# Patient Record
Sex: Female | Born: 1935 | Race: Black or African American | Hispanic: No | Marital: Married
Health system: Southern US, Community
[De-identification: ages and names within clinical notes are randomized; demographics above are authoritative.]

## PROBLEM LIST (undated history)

## (undated) DIAGNOSIS — A419 Sepsis, unspecified organism: Secondary | ICD-10-CM

## (undated) DIAGNOSIS — E049 Nontoxic goiter, unspecified: Secondary | ICD-10-CM

## (undated) DIAGNOSIS — F0390 Unspecified dementia without behavioral disturbance: Secondary | ICD-10-CM

## (undated) DIAGNOSIS — J9621 Acute and chronic respiratory failure with hypoxia: Principal | ICD-10-CM

## (undated) DIAGNOSIS — J690 Pneumonitis due to inhalation of food and vomit: Secondary | ICD-10-CM

## (undated) DIAGNOSIS — N183 Chronic kidney disease, stage 3 (moderate): Secondary | ICD-10-CM

## (undated) DIAGNOSIS — D649 Anemia, unspecified: Secondary | ICD-10-CM

## (undated) DIAGNOSIS — K222 Esophageal obstruction: Secondary | ICD-10-CM

## (undated) DIAGNOSIS — R6521 Severe sepsis with septic shock: Secondary | ICD-10-CM

---

## 2006-09-17 ENCOUNTER — Ambulatory Visit (HOSPITAL_COMMUNITY): Admission: RE | Admit: 2006-09-17 | Discharge: 2006-09-17 | Payer: Self-pay | Admitting: Cardiology

## 2018-06-19 ENCOUNTER — Ambulatory Visit (HOSPITAL_COMMUNITY)
Admission: AD | Admit: 2018-06-19 | Discharge: 2018-06-19 | Disposition: A | Discharge status: Home / Self Care | Payer: Medicare Other | Source: Other Acute Inpatient Hospital | Attending: Internal Medicine | Admitting: Internal Medicine

## 2018-06-19 ENCOUNTER — Inpatient Hospital Stay
Admission: RE | Admit: 2018-06-19 | Discharge: 2018-07-21 | Disposition: A | Discharge status: Skilled Nursing Facility | Payer: Medicare Other | Source: Other Acute Inpatient Hospital | Attending: Internal Medicine | Admitting: Internal Medicine

## 2018-06-19 ENCOUNTER — Institutional Professional Consult (permissible substitution) (HOSPITAL_COMMUNITY): Payer: Medicare (Managed Care)

## 2018-06-19 DIAGNOSIS — N183 Chronic kidney disease, stage 3 unspecified: Secondary | ICD-10-CM

## 2018-06-19 DIAGNOSIS — J9690 Respiratory failure, unspecified, unspecified whether with hypoxia or hypercapnia: Secondary | ICD-10-CM | POA: Insufficient documentation

## 2018-06-19 DIAGNOSIS — R6521 Severe sepsis with septic shock: Secondary | ICD-10-CM

## 2018-06-19 DIAGNOSIS — F0390 Unspecified dementia without behavioral disturbance: Secondary | ICD-10-CM

## 2018-06-19 DIAGNOSIS — R0902 Hypoxemia: Secondary | ICD-10-CM

## 2018-06-19 DIAGNOSIS — Z431 Encounter for attention to gastrostomy: Secondary | ICD-10-CM

## 2018-06-19 DIAGNOSIS — J9621 Acute and chronic respiratory failure with hypoxia: Secondary | ICD-10-CM

## 2018-06-19 DIAGNOSIS — J690 Pneumonitis due to inhalation of food and vomit: Secondary | ICD-10-CM

## 2018-06-19 DIAGNOSIS — A419 Sepsis, unspecified organism: Secondary | ICD-10-CM

## 2018-06-19 HISTORY — DX: Chronic kidney disease, stage 3 (moderate): N18.3

## 2018-06-19 HISTORY — DX: Esophageal obstruction: K22.2

## 2018-06-19 HISTORY — DX: Unspecified dementia without behavioral disturbance: F03.90

## 2018-06-19 HISTORY — DX: Acute and chronic respiratory failure with hypoxia: J96.21

## 2018-06-19 HISTORY — DX: Sepsis, unspecified organism: A41.9

## 2018-06-19 HISTORY — DX: Nontoxic goiter, unspecified: E04.9

## 2018-06-19 HISTORY — DX: Pneumonitis due to inhalation of food and vomit: J69.0

## 2018-06-19 HISTORY — DX: Severe sepsis with septic shock: R65.21

## 2018-06-19 HISTORY — DX: Anemia, unspecified: D64.9

## 2018-06-19 LAB — BLOOD GAS, ARTERIAL
ACID-BASE EXCESS: 3.1 mmol/L — AB (ref 0.0–2.0)
BICARBONATE: 26.7 mmol/L (ref 20.0–28.0)
FIO2: 35
LHR: 10 {breaths}/min
MECHVT: 350 mL
O2 Saturation: 90.8 %
PATIENT TEMPERATURE: 98.6
PCO2 ART: 37.5 mmHg (ref 32.0–48.0)
PEEP/CPAP: 5 cmH2O
PH ART: 7.466 — AB (ref 7.350–7.450)
PO2 ART: 58.6 mmHg — AB (ref 83.0–108.0)

## 2018-06-19 MED ORDER — IOPAMIDOL (ISOVUE-300) INJECTION 61%
INTRAVENOUS | Status: AC
  Filled 2018-06-19: qty 50

## 2018-06-20 ENCOUNTER — Encounter: Payer: Self-pay | Admitting: Internal Medicine

## 2018-06-20 DIAGNOSIS — J9621 Acute and chronic respiratory failure with hypoxia: Secondary | ICD-10-CM

## 2018-06-20 DIAGNOSIS — R6521 Severe sepsis with septic shock: Secondary | ICD-10-CM

## 2018-06-20 DIAGNOSIS — A419 Sepsis, unspecified organism: Secondary | ICD-10-CM

## 2018-06-20 DIAGNOSIS — J690 Pneumonitis due to inhalation of food and vomit: Secondary | ICD-10-CM

## 2018-06-20 DIAGNOSIS — N183 Chronic kidney disease, stage 3 unspecified: Secondary | ICD-10-CM

## 2018-06-20 DIAGNOSIS — F0390 Unspecified dementia without behavioral disturbance: Secondary | ICD-10-CM

## 2018-06-20 HISTORY — DX: Chronic kidney disease, stage 3 unspecified: N18.30

## 2018-06-20 HISTORY — DX: Sepsis, unspecified organism: A41.9

## 2018-06-20 HISTORY — DX: Unspecified dementia, unspecified severity, without behavioral disturbance, psychotic disturbance, mood disturbance, and anxiety: F03.90

## 2018-06-20 HISTORY — DX: Pneumonitis due to inhalation of food and vomit: J69.0

## 2018-06-20 HISTORY — DX: Acute and chronic respiratory failure with hypoxia: J96.21

## 2018-06-20 LAB — COMPREHENSIVE METABOLIC PANEL
ALT: 21 U/L (ref 0–44)
AST: 29 U/L (ref 15–41)
Albumin: 2.5 g/dL — ABNORMAL LOW (ref 3.5–5.0)
Alkaline Phosphatase: 108 U/L (ref 38–126)
Anion gap: 11 (ref 5–15)
BILIRUBIN TOTAL: 0.4 mg/dL (ref 0.3–1.2)
BUN: 29 mg/dL — ABNORMAL HIGH (ref 8–23)
CHLORIDE: 102 mmol/L (ref 98–111)
CO2: 26 mmol/L (ref 22–32)
CREATININE: 1.19 mg/dL — AB (ref 0.44–1.00)
Calcium: 9.4 mg/dL (ref 8.9–10.3)
GFR, EST AFRICAN AMERICAN: 47 mL/min — AB (ref >60)
GFR, EST NON AFRICAN AMERICAN: 40 mL/min — AB (ref >60)
Glucose, Bld: 110 mg/dL — ABNORMAL HIGH (ref 70–99)
POTASSIUM: 3.9 mmol/L (ref 3.5–5.1)
Sodium: 139 mmol/L (ref 135–145)
TOTAL PROTEIN: 6 g/dL — AB (ref 6.5–8.1)

## 2018-06-20 LAB — CBC WITH DIFFERENTIAL/PLATELET
Abs Immature Granulocytes: 0 10*3/uL (ref 0.0–0.1)
BASOS PCT: 1 %
Basophils Absolute: 0 10*3/uL (ref 0.0–0.1)
EOS ABS: 0.3 10*3/uL (ref 0.0–0.7)
Eosinophils Relative: 7 %
HCT: 30.1 % — ABNORMAL LOW (ref 36.0–46.0)
Hemoglobin: 9.3 g/dL — ABNORMAL LOW (ref 12.0–15.0)
Immature Granulocytes: 1 %
Lymphocytes Relative: 17 %
Lymphs Abs: 0.8 10*3/uL (ref 0.7–4.0)
MCH: 27.3 pg (ref 26.0–34.0)
MCHC: 30.9 g/dL (ref 30.0–36.0)
MCV: 88.3 fL (ref 78.0–100.0)
MONO ABS: 0.7 10*3/uL (ref 0.1–1.0)
MONOS PCT: 13 %
Neutro Abs: 3 10*3/uL (ref 1.7–7.7)
Neutrophils Relative %: 61 %
PLATELETS: 311 10*3/uL (ref 150–400)
RBC: 3.41 MIL/uL — ABNORMAL LOW (ref 3.87–5.11)
RDW: 15.9 % — AB (ref 11.5–15.5)
WBC: 4.9 10*3/uL (ref 4.0–10.5)

## 2018-06-20 LAB — PROTIME-INR
INR: 1.06
PROTHROMBIN TIME: 13.8 s (ref 11.4–15.2)

## 2018-06-20 NOTE — Consult Note (Signed)
[facility]  Date of Service: 06/20/2018  PULMONARY CONSULT   Erica PoissonLoretta Y Johnson  NWG:[id number]  DOB: 1930-07-09   DOA: 06/19/2018  Referring Physician: Carron CurieAli Hijazi, MD  HPI: Erica Johnson is a 84 y.o. female seen for follow up of Acute on Chronic Respiratory Failure.  Patient has multiple medical problems including dementia chronic goiter esophageal obstruction who presented to the hospital with a history of coughing.  Apparently coughing was going on for a couple of days prior to admission.  In addition the family states that she was less vocal.  She was also found to be hypothermic.  On admission her white count was decreased and she was dehydrated.  She was also noted to be hypoglycemic and initially was started on D5 normal saline for the low sugar.  Patient's blood pressure was also low and she was started on levo fed on admission.  It is recorded that the additional blood pressure was 68/33.  After fluid resuscitation patient did show some improvement.  Hospital course was as follows.  She was initially felt to be septic cultures were done that were negative.  Patient was switched over to oral antibiotics and it was improved white count.  She did have the renal failure she was resuscitated with fluids and apparently her baseline creatinine is around 1.6.  Patient was transferred to our facility for further management.  Right now she is on the ventilator and assist control mode with an FiO2 of 35%.  Patient is requiring a PEEP of 5 and today she was started on a wean.  Review of Systems:  ROS performed and is unremarkable other than noted above.  Past Medical History:  Diagnosis Date  . Acute on chronic respiratory failure with hypoxia (HCC) 06/20/2018  . Aspiration pneumonia of both lower lobes due to gastric secretions (HCC) 06/20/2018  . Chronic anemia   . CKD (chronic kidney disease), stage III (HCC) 06/20/2018  .  Dementia, in, senility, without behavioral disturbance 06/20/2018  . Esophageal obstruction   . Goiter   . Severe sepsis with septic shock (HCC) 06/20/2018    History reviewed. No pertinent surgical history.  Social History:    has an unknown smoking status. She has never used smokeless tobacco. She reports that she drank alcohol. She reports that she has current or past drug history.  Family History: Non-Contributory to the present illness  Allergies not on file  Medications: Reviewed on Rounds  Physical Exam:  Vitals: Temperature 97.7 pulse 78 respiratory 20 blood pressure 107/60 saturations 99%  Ventilator Settings mode of ventilation is assist control FiO2 35% tidal volume 368 PEEP 5  . General: Comfortable at this time . Eyes: Grossly normal lids, irises & conjunctiva . ENT: grossly tongue is normal . Neck: no obvious mass . Cardiovascular: S1-S2 normal no gallop or rub is noted . Respiratory: Coarse breath sounds no rhonchi . Abdomen: Soft and nontender . Skin: no rash seen on limited exam . Musculoskeletal: not rigid . Psychiatric:unable to assess . Neurologic: no seizure no involuntary movements         Labs on Admission:  Basic Metabolic Panel: Recent Labs  Lab 06/20/18 0632  NA 139  K 3.9  CL 102  CO2 26  GLUCOSE 110*  BUN 29*  CREATININE 1.19*  CALCIUM 9.4    Liver Function Tests: Recent Labs  Lab 06/20/18 0632  AST 29  ALT 21  ALKPHOS 108  BILITOT 0.4  PROT 6.0*  ALBUMIN 2.5*   No results for input(s): LIPASE, AMYLASE in the last 168 hours. No results for input(s): AMMONIA in the last 168 hours.  CBC: Recent Labs  Lab 06/20/18 0632  WBC 4.9  NEUTROABS 3.0  HGB 9.3*  HCT 30.1*  MCV 88.3  PLT 311    Cardiac Enzymes: No results for input(s): CKTOTAL, CKMB, CKMBINDEX, TROPONINI in the last 168 hours.  BNP (last 3 results) No results for input(s): BNP in the last 8760 hours.  ProBNP (last 3 results) No results for input(s):  PROBNP in the last 8760 hours.   Radiological Exams on Admission: Dg Chest Port 1 View  Result Date: 06/19/2018 CLINICAL DATA:  New admission.  Respiratory failure. EXAM: PORTABLE CHEST 1 VIEW COMPARISON:  None. FINDINGS: No pneumothorax. The right PICC line terminates in the central SVC. Tracheostomy tube is noted. A tubular device over the left lower chest is probably on the patient. Probable mild cardiomegaly. The hila and mediastinum are unremarkable. No pulmonary nodules or masses. Mild opacity in the left base. IMPRESSION: Mild opacity in left base. Support apparatus as above. No other acute abnormalities. Electronically Signed   By: Gerome Sam III M.D   On: 06/19/2018 19:34   Dg Abd Portable 1v  Result Date: 06/19/2018 CLINICAL DATA:  Evaluate PEG tube placement. EXAM: PORTABLE ABDOMEN - 1 VIEW COMPARISON:  None. FINDINGS: Contrast has been injected through the PEG tube and fills the fundus of the stomach. There is a small amount of contrast in the distal stomach and probably the proximal duodenum. No evidence of leak. Left basilar opacity and probable small effusion. IMPRESSION: 1. The PEG tube appears to terminate in the stomach, confirmed with contrast injection. No leak. Electronically Signed   By: Gerome Sam III M.D   On: 06/19/2018 19:36    Assessment/Plan Principal Problem:   Acute on chronic respiratory failure with hypoxia (HCC) Active Problems:   Severe sepsis with septic shock (HCC)   CKD (chronic kidney disease), stage III (HCC)   Dementia, in, senility, without behavioral disturbance   Aspiration pneumonia of both lower lobes due to gastric secretions (HCC)   1. Acute on chronic respiratory failure with hypoxia at this time patient is on full vent support.  She was started on pressure support weaning this morning.  She was able to do about 2 hours on pressure support.  Currently FiO2 is 35% will titrate oxygen down as tolerated. 2. Sepsis hemodynamically stable  resolved has been treated with IV antibiotics we will continue with supportive care. 3. Chronic kidney disease stage III last creatinine seems to be stable we will continue to follow along. 4. Dementia and encephalopathy she is at her baseline.  We will continue to monitor.  Deconditioning will also need physical therapy. 5. Pneumonia due to aspiration patient was treated with antibiotics again follow clinically and treat with antibiotics as necessary right now is afebrile and stable  I have personally seen and evaluated the patient, evaluated laboratory and imaging results, formulated the assessment and plan and placed orders. The Patient requires high complexity decision making for assessment and support.  Case was discussed on Rounds with the Respiratory Therapy Staff Time Spent  Yevonne Pax, MD [facility] Pulmonary Critical Care Medicine Sleep Medicine

## 2018-06-21 DIAGNOSIS — N183 Chronic kidney disease, stage 3 (moderate): Secondary | ICD-10-CM | POA: Diagnosis not present

## 2018-06-21 DIAGNOSIS — J690 Pneumonitis due to inhalation of food and vomit: Secondary | ICD-10-CM | POA: Diagnosis not present

## 2018-06-21 DIAGNOSIS — J9621 Acute and chronic respiratory failure with hypoxia: Secondary | ICD-10-CM | POA: Diagnosis not present

## 2018-06-21 DIAGNOSIS — F0390 Unspecified dementia without behavioral disturbance: Secondary | ICD-10-CM | POA: Diagnosis not present

## 2018-06-21 NOTE — Progress Notes (Signed)
[facility]  PROGRESS NOTE  Date of Service: 06/21/2018  Erica PoissonLoretta Y Johnson  NWG:[id number]  DOB: 12-03-1929   DOA: 06/19/2018  Referring Physician: Carron CurieAli Hijazi, MD  HPI: Erica PoissonLoretta Y Lucchesi is a 84 y.o. female seen for follow up of Acute on Chronic Respiratory Failure.  Currently is weaning on pressure support doing fairly well.  Patient is on 28% FiO2 on pressure support 12/5  Medications: Reviewed on Rounds  Physical Exam:  Vitals: Temperature 98.7 pulse 67 respiratory 18 blood pressure 106/58 saturations 99%  Ventilator Settings mode of ventilation pressure support FiO2 20% tidal volume 300 pressure support 12 PEEP 5  . General: Comfortable at this time . Eyes: Grossly normal lids, irises & conjunctiva . ENT: grossly tongue is normal . Neck: no obvious mass . Cardiovascular: S1 S2 normal no gallop . Respiratory: No rhonchi or rales are noted . Abdomen: soft . Skin: no rash seen on limited exam . Musculoskeletal: not rigid . Psychiatric:unable to assess . Neurologic: no seizure no involuntary movements         Lab Data:   Basic Metabolic Panel: Recent Labs  Lab 06/20/18 0632  NA 139  K 3.9  CL 102  CO2 26  GLUCOSE 110*  BUN 29*  CREATININE 1.19*  CALCIUM 9.4    Liver Function Tests: Recent Labs  Lab 06/20/18 0632  AST 29  ALT 21  ALKPHOS 108  BILITOT 0.4  PROT 6.0*  ALBUMIN 2.5*   No results for input(s): LIPASE, AMYLASE in the last 168 hours. No results for input(s): AMMONIA in the last 168 hours.  CBC: Recent Labs  Lab 06/20/18 0632  WBC 4.9  NEUTROABS 3.0  HGB 9.3*  HCT 30.1*  MCV 88.3  PLT 311    Cardiac Enzymes: No results for input(s): CKTOTAL, CKMB, CKMBINDEX, TROPONINI in the last 168 hours.  BNP (last 3 results) No results for input(s): BNP in the last 8760 hours.  ProBNP (last 3 results) No results for input(s): PROBNP in the last 8760  hours.  Radiological Exams: Dg Chest Port 1 View  Result Date: 06/19/2018 CLINICAL DATA:  New admission.  Respiratory failure. EXAM: PORTABLE CHEST 1 VIEW COMPARISON:  None. FINDINGS: No pneumothorax. The right PICC line terminates in the central SVC. Tracheostomy tube is noted. A tubular device over the left lower chest is probably on the patient. Probable mild cardiomegaly. The hila and mediastinum are unremarkable. No pulmonary nodules or masses. Mild opacity in the left base. IMPRESSION: Mild opacity in left base. Support apparatus as above. No other acute abnormalities. Electronically Signed   By: Gerome Samavid  Williams III M.D   On: 06/19/2018 19:34   Dg Abd Portable 1v  Result Date: 06/19/2018 CLINICAL DATA:  Evaluate PEG tube placement. EXAM: PORTABLE ABDOMEN - 1 VIEW COMPARISON:  None. FINDINGS: Contrast has been injected through the PEG tube and fills the fundus of the stomach. There is a small amount of contrast in the distal stomach and probably the proximal duodenum. No evidence of leak. Left basilar opacity and probable small effusion. IMPRESSION: 1. The PEG tube appears to terminate in the stomach, confirmed with contrast injection. No leak. Electronically Signed   By: Gerome Samavid  Williams III M.D   On: 06/19/2018 19:36    Assessment/Plan Principal Problem:   Acute on chronic respiratory failure with hypoxia (HCC) Active Problems:   Severe sepsis with septic shock (HCC)   CKD (chronic kidney disease), stage III (HCC)  Dementia, in, senility, without behavioral disturbance   Aspiration pneumonia of both lower lobes due to gastric secretions (HCC)   1. Acute on chronic respiratory failure with hypoxia continue weaning on pressure support per protocol. 2. 12. 3. Severe sepsis shock hemodynamically stable continue with present therapy 4. Chronic kidney disease stage III follow-up labs 5. Dementia stable unchanged 6. Pneumonia due to aspiration treated with antibiotics follow-up x-ray as  necessary   I have personally seen and evaluated the patient, evaluated laboratory and imaging results, formulated the assessment and plan and placed orders. The Patient requires high complexity decision making for assessment and support.  Case was discussed on Rounds with the Respiratory Therapy Staff  Yevonne PaxSaadat A Khan, MD [facility] Pulmonary Critical Care Medicine Sleep Medicine

## 2018-06-22 DIAGNOSIS — F0390 Unspecified dementia without behavioral disturbance: Secondary | ICD-10-CM | POA: Diagnosis not present

## 2018-06-22 DIAGNOSIS — N183 Chronic kidney disease, stage 3 (moderate): Secondary | ICD-10-CM | POA: Diagnosis not present

## 2018-06-22 DIAGNOSIS — J690 Pneumonitis due to inhalation of food and vomit: Secondary | ICD-10-CM | POA: Diagnosis not present

## 2018-06-22 DIAGNOSIS — J9621 Acute and chronic respiratory failure with hypoxia: Secondary | ICD-10-CM | POA: Diagnosis not present

## 2018-06-22 NOTE — Progress Notes (Signed)
[facility]  PROGRESS NOTE  Date of Service: 06/22/2018  Erica PoissonLoretta Y Johnson  ZOX:[id number]  DOB: 01-29-1930   DOA: 06/19/2018  Referring Physician: Carron CurieAli Hijazi, MD  HPI: Erica Johnson is a 84 y.o. female seen for follow up of Acute on Chronic Respiratory Failure.  Patient is on pressure support the goal is currently to try to wean for about 8 hours.  Medications: Reviewed on Rounds  Physical Exam:  Vitals: Temperature 98.0 pulse 69 respiratory rate 16 blood pressure 101/54 saturations 95%  Ventilator Settings mode of ventilation pressure support FiO2 28% tidal volume is 354 pressure support 12 PEEP 5  . General: Comfortable at this time . Eyes: Grossly normal lids, irises & conjunctiva . ENT: grossly tongue is normal . Neck: no obvious mass . Cardiovascular: S1 S2 normal no gallop . Respiratory: No rhonchi or rales are noted . Abdomen: soft . Skin: no rash seen on limited exam . Musculoskeletal: not rigid . Psychiatric:unable to assess . Neurologic: no seizure no involuntary movements         Lab Data:   Basic Metabolic Panel: Recent Labs  Lab 06/20/18 0632  NA 139  K 3.9  CL 102  CO2 26  GLUCOSE 110*  BUN 29*  CREATININE 1.19*  CALCIUM 9.4    Liver Function Tests: Recent Labs  Lab 06/20/18 0632  AST 29  ALT 21  ALKPHOS 108  BILITOT 0.4  PROT 6.0*  ALBUMIN 2.5*   No results for input(s): LIPASE, AMYLASE in the last 168 hours. No results for input(s): AMMONIA in the last 168 hours.  CBC: Recent Labs  Lab 06/20/18 0632  WBC 4.9  NEUTROABS 3.0  HGB 9.3*  HCT 30.1*  MCV 88.3  PLT 311    Cardiac Enzymes: No results for input(s): CKTOTAL, CKMB, CKMBINDEX, TROPONINI in the last 168 hours.  BNP (last 3 results) No results for input(s): BNP in the last 8760 hours.  ProBNP (last 3 results) No results for input(s): PROBNP in the last 8760 hours.  Radiological Exams: No  results found.  Assessment/Plan Principal Problem:   Acute on chronic respiratory failure with hypoxia (HCC) Active Problems:   Severe sepsis with septic shock (HCC)   CKD (chronic kidney disease), stage III (HCC)   Dementia, in, senility, without behavioral disturbance   Aspiration pneumonia of both lower lobes due to gastric secretions (HCC)   1. Acute on chronic respiratory failure with hypoxia we will continue to try to advance the weaning on pressure support as tolerated.  Patient's family was present in the room her daughter was updated. 2. Severe sepsis with shock hemodynamically stable 3. Chronic kidney disease stage III follow labs 4. Dementia unchanged 5. Pneumonia due to aspiration at baseline we will continue with supportive care follow-up x-ray as necessary   I have personally seen and evaluated the patient, evaluated laboratory and imaging results, formulated the assessment and plan and placed orders. The Patient requires high complexity decision making for assessment and support.  Case was discussed on Rounds with the Respiratory Therapy Staff  Yevonne PaxSaadat A Earnstine Meinders, MD [facility] Pulmonary Critical Care Medicine Sleep Medicine

## 2018-06-23 DIAGNOSIS — F0390 Unspecified dementia without behavioral disturbance: Secondary | ICD-10-CM | POA: Diagnosis not present

## 2018-06-23 DIAGNOSIS — J9621 Acute and chronic respiratory failure with hypoxia: Secondary | ICD-10-CM | POA: Diagnosis not present

## 2018-06-23 DIAGNOSIS — N183 Chronic kidney disease, stage 3 (moderate): Secondary | ICD-10-CM | POA: Diagnosis not present

## 2018-06-23 DIAGNOSIS — J690 Pneumonitis due to inhalation of food and vomit: Secondary | ICD-10-CM | POA: Diagnosis not present

## 2018-06-23 NOTE — Progress Notes (Signed)
[facility]  PROGRESS NOTE  Date of Service: 06/23/2018  Erica PoissonLoretta Y Doorn  VHQ:[id number]:[id number]  DOB: 1930/08/11   DOA: 06/19/2018  Referring Physician: Carron CurieAli Hijazi, MD  HPI: Erica Johnson is a 84 y.o. female seen for follow up of Acute on Chronic Respiratory Failure.  Comfortable no distress this morning patient was on assist control mode and on 28% FiO2.  Saturations are acceptable.  We will check the spontaneous breathing trial once again  Medications: Reviewed on Rounds  Physical Exam:  Vitals: Temperature 99.4 pulse 73 respiratory 14 blood pressure 100/55 saturations 100%  Ventilator Settings mode of ventilation assist control FiO2 28% tidal volume 363 PEEP 5  . General: Comfortable at this time . Eyes: Grossly normal lids, irises & conjunctiva . ENT: grossly tongue is normal . Neck: no obvious mass . Cardiovascular: S1 S2 normal no gallop . Respiratory: No rhonchi or rales noted . Abdomen: soft . Skin: no rash seen on limited exam . Musculoskeletal: not rigid . Psychiatric:unable to assess . Neurologic: no seizure no involuntary movements         Lab Data:   Basic Metabolic Panel: Recent Labs  Lab 06/20/18 0632  NA 139  K 3.9  CL 102  CO2 26  GLUCOSE 110*  BUN 29*  CREATININE 1.19*  CALCIUM 9.4    Liver Function Tests: Recent Labs  Lab 06/20/18 0632  AST 29  ALT 21  ALKPHOS 108  BILITOT 0.4  PROT 6.0*  ALBUMIN 2.5*   No results for input(s): LIPASE, AMYLASE in the last 168 hours. No results for input(s): AMMONIA in the last 168 hours.  CBC: Recent Labs  Lab 06/20/18 0632  WBC 4.9  NEUTROABS 3.0  HGB 9.3*  HCT 30.1*  MCV 88.3  PLT 311    Cardiac Enzymes: No results for input(s): CKTOTAL, CKMB, CKMBINDEX, TROPONINI in the last 168 hours.  BNP (last 3 results) No results for input(s): BNP in the last 8760 hours.  ProBNP (last 3 results) No results for input(s):  PROBNP in the last 8760 hours.  Radiological Exams: No results found.  Assessment/Plan Principal Problem:   Acute on chronic respiratory failure with hypoxia (HCC) Active Problems:   Severe sepsis with septic shock (HCC)   CKD (chronic kidney disease), stage III (HCC)   Dementia, in, senility, without behavioral disturbance   Aspiration pneumonia of both lower lobes due to gastric secretions (HCC)   1. Acute on chronic respiratory failure with hypoxia we will continue with full support on assist control and check daily spontaneous breathing trials if patient is ready to advance. 2. Severe sepsis with shock hemodynamically stable continue present management. 3. Chronic kidney disease stage III stable will follow 4. Dementia stable we will continue with present therapy 5. Aspiration pneumonia treated with antibiotics continue to follow with follow-up x-rays as needed   I have personally seen and evaluated the patient, evaluated laboratory and imaging results, formulated the assessment and plan and placed orders. The Patient requires high complexity decision making for assessment and support.  Case was discussed on Rounds with the Respiratory Therapy Staff  Yevonne PaxSaadat A Khan, MD [facility] Pulmonary Critical Care Medicine Sleep Medicine

## 2018-06-24 ENCOUNTER — Other Ambulatory Visit (HOSPITAL_COMMUNITY): Payer: Medicare (Managed Care)

## 2018-06-24 DIAGNOSIS — N183 Chronic kidney disease, stage 3 (moderate): Secondary | ICD-10-CM | POA: Diagnosis not present

## 2018-06-24 DIAGNOSIS — J690 Pneumonitis due to inhalation of food and vomit: Secondary | ICD-10-CM | POA: Diagnosis not present

## 2018-06-24 DIAGNOSIS — J9621 Acute and chronic respiratory failure with hypoxia: Secondary | ICD-10-CM | POA: Diagnosis not present

## 2018-06-24 DIAGNOSIS — F0390 Unspecified dementia without behavioral disturbance: Secondary | ICD-10-CM | POA: Diagnosis not present

## 2018-06-24 NOTE — Progress Notes (Signed)
[facility]  PROGRESS NOTE  Date of Service: 06/24/2018  Hosie PoissonLoretta Y Tellefsen  WUJ:[id number]  DOB: 1930/06/27   DOA: 06/19/2018  Referring Physician: Carron CurieAli Hijazi, MD  HPI: Hosie PoissonLoretta Y Loveland is a 84 y.o. female seen for follow up of Acute on Chronic Respiratory Failure.  Patient is comfortable without distress this morning was attempted on the wean she did not tolerate it.  She was actually switched back to assist control mode.  Patient's respiratory rate had gone up and her volumes have gone down.  She has not had a chest x-ray done in almost 2 weeks and therefore I suggested getting a follow-up chest x-ray.  Medications: Reviewed on Rounds  Physical Exam:  Vitals: Temperature 99.4 pulse 78 respiratory 26 blood pressure 105/65 saturations 99%  Ventilator Settings mode of ventilation assist control FiO2 28% tidal volume 365 PEEP 5  . General: Comfortable at this time . Eyes: Grossly normal lids, irises & conjunctiva . ENT: grossly tongue is normal . Neck: no obvious mass . Cardiovascular: S1 S2 normal no gallop . Respiratory: No rhonchi or rales are noted . Abdomen: soft . Skin: no rash seen on limited exam . Musculoskeletal: not rigid . Psychiatric:unable to assess . Neurologic: no seizure no involuntary movements         Lab Data:   Basic Metabolic Panel: Recent Labs  Lab 06/20/18 0632  NA 139  K 3.9  CL 102  CO2 26  GLUCOSE 110*  BUN 29*  CREATININE 1.19*  CALCIUM 9.4    Liver Function Tests: Recent Labs  Lab 06/20/18 0632  AST 29  ALT 21  ALKPHOS 108  BILITOT 0.4  PROT 6.0*  ALBUMIN 2.5*   No results for input(s): LIPASE, AMYLASE in the last 168 hours. No results for input(s): AMMONIA in the last 168 hours.  CBC: Recent Labs  Lab 06/20/18 0632  WBC 4.9  NEUTROABS 3.0  HGB 9.3*  HCT 30.1*  MCV 88.3  PLT 311    Cardiac Enzymes: No results for input(s): CKTOTAL, CKMB,  CKMBINDEX, TROPONINI in the last 168 hours.  BNP (last 3 results) No results for input(s): BNP in the last 8760 hours.  ProBNP (last 3 results) No results for input(s): PROBNP in the last 8760 hours.  Radiological Exams: No results found.  Assessment/Plan Principal Problem:   Acute on chronic respiratory failure with hypoxia (HCC) Active Problems:   Severe sepsis with septic shock (HCC)   CKD (chronic kidney disease), stage III (HCC)   Dementia, in, senility, without behavioral disturbance   Aspiration pneumonia of both lower lobes due to gastric secretions (HCC)   1. Acute on chronic respiratory failure with hypoxia we will continue with full support on the ventilator at this time.  Respiratory therapy will check the spontaneous breathing trial again this afternoon.  If patient is able to we will get her back on a pressure support wean. 2. Severe sepsis with shock hemodynamically stable we will continue with supportive care 3. Chronic kidney disease stage III monitoring her creatinine which is up a little bit. 4. Pneumonia due to aspiration I have ordered a follow-up chest x-ray to reassess. 5. Dementia at baseline continue with supportive care she is not combative she is nonresponsive   I have personally seen and evaluated the patient, evaluated laboratory and imaging results, formulated the assessment and plan and placed orders. The Patient requires high complexity decision making for assessment and support.  Case was discussed on Rounds with the Respiratory Therapy Staff  Allyne Gee, MD [facility] Pulmonary Critical Care Medicine Sleep Medicine

## 2018-06-25 DIAGNOSIS — J9621 Acute and chronic respiratory failure with hypoxia: Secondary | ICD-10-CM | POA: Diagnosis not present

## 2018-06-25 DIAGNOSIS — F0390 Unspecified dementia without behavioral disturbance: Secondary | ICD-10-CM | POA: Diagnosis not present

## 2018-06-25 DIAGNOSIS — N183 Chronic kidney disease, stage 3 (moderate): Secondary | ICD-10-CM | POA: Diagnosis not present

## 2018-06-25 DIAGNOSIS — J690 Pneumonitis due to inhalation of food and vomit: Secondary | ICD-10-CM | POA: Diagnosis not present

## 2018-06-25 LAB — BASIC METABOLIC PANEL
Anion gap: 10 (ref 5–15)
BUN: 47 mg/dL — ABNORMAL HIGH (ref 8–23)
CALCIUM: 9.7 mg/dL (ref 8.9–10.3)
CO2: 27 mmol/L (ref 22–32)
CREATININE: 1.22 mg/dL — AB (ref 0.44–1.00)
Chloride: 99 mmol/L (ref 98–111)
GFR calc non Af Amer: 39 mL/min — ABNORMAL LOW (ref >60)
GFR, EST AFRICAN AMERICAN: 45 mL/min — AB (ref >60)
Glucose, Bld: 111 mg/dL — ABNORMAL HIGH (ref 70–99)
Potassium: 3.5 mmol/L (ref 3.5–5.1)
SODIUM: 136 mmol/L (ref 135–145)

## 2018-06-25 NOTE — Progress Notes (Signed)
[facility]   PULMONARY SERVICE  PROGRESS NOTE  Date of Service: 06/25/2018  Erica PoissonLoretta Y Bolander  ZOX:[id number]:[id number]  DOB: 11/22/1929   DOA: 06/19/2018  Referring Physician: Carron CurieAli Hijazi, MD  HPI: Erica PoissonLoretta Y Johnson is a 84 y.o. female seen for follow up of Acute on Chronic Respiratory Failure.  This morning patient was weaning currently is on pressure support mode has been on 28% oxygen good saturations  Medications: Reviewed on Rounds  Physical Exam:  Vitals: Temperature 98.6 pulse 66 respiratory rate 21 blood pressure 119/63 saturations 100%  Ventilator Settings mode of ventilation pressure support FiO2 20% tidal volume 333 pressure support 12 PEEP 5  . General: Comfortable at this time . Eyes: Grossly normal lids, irises & conjunctiva . ENT: grossly tongue is normal . Neck: no obvious mass . Cardiovascular: S1 S2 normal no gallop . Respiratory: No rhonchi no rales . Abdomen: soft . Skin: no rash seen on limited exam . Musculoskeletal: not rigid . Psychiatric:unable to assess . Neurologic: no seizure no involuntary movements         Lab Data:   Basic Metabolic Panel: Recent Labs  Lab 06/20/18 0632 06/25/18 0536  NA 139 136  K 3.9 3.5  CL 102 99  CO2 26 27  GLUCOSE 110* 111*  BUN 29* 47*  CREATININE 1.19* 1.22*  CALCIUM 9.4 9.7    Liver Function Tests: Recent Labs  Lab 06/20/18 0632  AST 29  ALT 21  ALKPHOS 108  BILITOT 0.4  PROT 6.0*  ALBUMIN 2.5*   No results for input(s): LIPASE, AMYLASE in the last 168 hours. No results for input(s): AMMONIA in the last 168 hours.  CBC: Recent Labs  Lab 06/20/18 0632  WBC 4.9  NEUTROABS 3.0  HGB 9.3*  HCT 30.1*  MCV 88.3  PLT 311    Cardiac Enzymes: No results for input(s): CKTOTAL, CKMB, CKMBINDEX, TROPONINI in the last 168 hours.  BNP (last 3 results) No results for input(s): BNP in the last 8760 hours.  ProBNP (last 3 results) No results for  input(s): PROBNP in the last 8760 hours.  Radiological Exams: Dg Chest Port 1 View  Result Date: 06/24/2018 CLINICAL DATA:  Chronic ventilator dependent respiratory failure. Hypoxia. Follow-up LEFT basilar atelectasis and/or pneumonia. EXAM: PORTABLE CHEST 1 VIEW COMPARISON:  06/19/2018. FINDINGS: Tracheostomy to tip below the thoracic inlet, unchanged. Cardiac silhouette upper normal in size to slightly enlarged for AP portable technique, unchanged. Patchy airspace opacities at the LEFT lung base are unchanged. No new pulmonary parenchymal abnormalities. Normal pulmonary vascularity. No visible pleural effusions. IMPRESSION: 1. Stable pneumonia involving the LEFT lung base since the examination 5 days ago. 2. No new abnormalities. Electronically Signed   By: Hulan Saashomas  Lawrence M.D.   On: 06/24/2018 16:32    Assessment/Plan Principal Problem:   Acute on chronic respiratory failure with hypoxia (HCC) Active Problems:   Severe sepsis with septic shock (HCC)   CKD (chronic kidney disease), stage III (HCC)   Dementia, in, senility, without behavioral disturbance   Aspiration pneumonia of both lower lobes due to gastric secretions (HCC)   1. Acute on chronic respiratory failure with hypoxia patient is weaning on pressure support mode as indicated above.  The goal is for 10 hours. 2. Severe sepsis with shock resolved hemodynamically stable 3. Chronic kidney disease stage III continue to follow labs 4. Dementia stable 5. Pneumonia due to aspiration recent chest x-ray still showing some infiltrate in the left lung base  we will discuss with the primary care team regarding antibiotic choice possible   I have personally seen and evaluated the patient, evaluated laboratory and imaging results, formulated the assessment and plan and placed orders. The Patient requires high complexity decision making for assessment and support.  Case was discussed on Rounds with the Respiratory Therapy Staff  Yevonne PaxSaadat A  Corry Storie, MD [facility] Pulmonary Critical Care Medicine Sleep Medicine

## 2018-06-27 DIAGNOSIS — J9621 Acute and chronic respiratory failure with hypoxia: Secondary | ICD-10-CM | POA: Diagnosis not present

## 2018-06-27 DIAGNOSIS — F0390 Unspecified dementia without behavioral disturbance: Secondary | ICD-10-CM | POA: Diagnosis not present

## 2018-06-27 DIAGNOSIS — J690 Pneumonitis due to inhalation of food and vomit: Secondary | ICD-10-CM | POA: Diagnosis not present

## 2018-06-27 DIAGNOSIS — N183 Chronic kidney disease, stage 3 (moderate): Secondary | ICD-10-CM | POA: Diagnosis not present

## 2018-06-27 NOTE — Progress Notes (Signed)
[facility]   PULMONARY SERVICE  PROGRESS NOTE  Date of Service: 06/27/2018  Erica Johnson  ZOX:[id number]:[id number]  DOB: June 10, 1930   DOA: 06/19/2018  Referring Physician: Carron CurieAli Hijazi, MD  HPI: Erica Johnson is a 84 y.o. female seen for follow up of Acute on Chronic Respiratory Failure.  She was weaning today to look a little bit better she was on pressure support mode the goal is for 16 hours  Medications: Reviewed on Rounds  Physical Exam:  Vitals: Temperature 96.5 pulse 67 respiratory 21 blood pressure 133/87 saturations 100%  Ventilator Settings mode of ventilation pressure support FiO2 20% tidal volume 319 pressure support 12 PEEP 5  . General: Comfortable at this time . Eyes: Grossly normal lids, irises & conjunctiva . ENT: grossly tongue is normal . Neck: no obvious mass . Cardiovascular: S1 S2 normal no gallop . Respiratory: No rhonchi rales noted . Abdomen: soft . Skin: no rash seen on limited exam . Musculoskeletal: not rigid . Psychiatric:unable to assess . Neurologic: no seizure no involuntary movements         Lab Data:   Basic Metabolic Panel: Recent Labs  Lab 06/25/18 0536  NA 136  K 3.5  CL 99  CO2 27  GLUCOSE 111*  BUN 47*  CREATININE 1.22*  CALCIUM 9.7    Liver Function Tests: No results for input(s): AST, ALT, ALKPHOS, BILITOT, PROT, ALBUMIN in the last 168 hours. No results for input(s): LIPASE, AMYLASE in the last 168 hours. No results for input(s): AMMONIA in the last 168 hours.  CBC: No results for input(s): WBC, NEUTROABS, HGB, HCT, MCV, PLT in the last 168 hours.  Cardiac Enzymes: No results for input(s): CKTOTAL, CKMB, CKMBINDEX, TROPONINI in the last 168 hours.  BNP (last 3 results) No results for input(s): BNP in the last 8760 hours.  ProBNP (last 3 results) No results for input(s): PROBNP in the last 8760 hours.  Radiological Exams: No results  found.  Assessment/Plan Principal Problem:   Acute on chronic respiratory failure with hypoxia (HCC) Active Problems:   Severe sepsis with septic shock (HCC)   CKD (chronic kidney disease), stage III (HCC)   Dementia, in, senility, without behavioral disturbance   Aspiration pneumonia of both lower lobes due to gastric secretions (HCC)   1. Acute on chronic respiratory failure with hypoxia we will continue on the weaning protocol advance as tolerated 16-hour goal is noted 2. Severe sepsis with shock hemodynamically stable we will continue with supportive care 3. Chronic kidney disease stage III follow-up labs 4. Dementia unchanged 5. Pneumonia due to aspiration treated follow-up x-ray   I have personally seen and evaluated the patient, evaluated laboratory and imaging results, formulated the assessment and plan and placed orders. The Patient requires high complexity decision making for assessment and support.  Case was discussed on Rounds with the Respiratory Therapy Staff  Yevonne PaxSaadat A Enez Monahan, MD [facility] Pulmonary Critical Care Medicine Sleep Medicine

## 2018-06-28 ENCOUNTER — Other Ambulatory Visit (HOSPITAL_COMMUNITY): Payer: Medicare (Managed Care)

## 2018-06-28 DIAGNOSIS — J9621 Acute and chronic respiratory failure with hypoxia: Secondary | ICD-10-CM | POA: Diagnosis not present

## 2018-06-28 DIAGNOSIS — F0390 Unspecified dementia without behavioral disturbance: Secondary | ICD-10-CM | POA: Diagnosis not present

## 2018-06-28 DIAGNOSIS — J690 Pneumonitis due to inhalation of food and vomit: Secondary | ICD-10-CM | POA: Diagnosis not present

## 2018-06-28 DIAGNOSIS — N183 Chronic kidney disease, stage 3 (moderate): Secondary | ICD-10-CM | POA: Diagnosis not present

## 2018-06-28 LAB — CBC
HCT: 30.6 % — ABNORMAL LOW (ref 36.0–46.0)
HEMOGLOBIN: 9.4 g/dL — AB (ref 12.0–15.0)
MCH: 26.7 pg (ref 26.0–34.0)
MCHC: 30.7 g/dL (ref 30.0–36.0)
MCV: 86.9 fL (ref 78.0–100.0)
Platelets: 403 10*3/uL — ABNORMAL HIGH (ref 150–400)
RBC: 3.52 MIL/uL — ABNORMAL LOW (ref 3.87–5.11)
RDW: 15.9 % — ABNORMAL HIGH (ref 11.5–15.5)
WBC: 6.1 10*3/uL (ref 4.0–10.5)

## 2018-06-28 LAB — BASIC METABOLIC PANEL
ANION GAP: 10 (ref 5–15)
BUN: 63 mg/dL — ABNORMAL HIGH (ref 8–23)
CHLORIDE: 98 mmol/L (ref 98–111)
CO2: 29 mmol/L (ref 22–32)
Calcium: 9.9 mg/dL (ref 8.9–10.3)
Creatinine, Ser: 1.62 mg/dL — ABNORMAL HIGH (ref 0.44–1.00)
GFR calc non Af Amer: 28 mL/min — ABNORMAL LOW (ref >60)
GFR, EST AFRICAN AMERICAN: 32 mL/min — AB (ref >60)
Glucose, Bld: 105 mg/dL — ABNORMAL HIGH (ref 70–99)
POTASSIUM: 3.4 mmol/L — AB (ref 3.5–5.1)
Sodium: 137 mmol/L (ref 135–145)

## 2018-06-28 NOTE — Progress Notes (Signed)
Pulmonary Critical Care [facility]   PULMONARY SERVICE  PROGRESS NOTE  Date of Service: 06/28/2018  Erica PoissonLoretta Y Mcclain  ZOX:[id number]:[id number]  DOB: 1930-08-30   DOA: 06/19/2018  Referring Physician: Carron CurieAli Hijazi, MD  HPI: Erica Johnson is a 84 y.o. female seen for follow up of Acute on Chronic Respiratory Failure.  Patient continues to wean currently is on pressure support has been on 28% FiO2 with good tolerance.  Plan is to try to advance to T collar today  Medications: Reviewed on Rounds  Physical Exam:  Vitals: Temperature 99.1 pulse 68 respiratory rate 14 blood pressure 107/56 saturations 100%  Ventilator Settings mode of ventilation pressure support FiO2 28% tidal volume 336 pressure support 12 PEEP 5  . General: Comfortable at this time . Eyes: Grossly normal lids, irises & conjunctiva . ENT: grossly tongue is normal . Neck: no obvious mass . Cardiovascular: S1 S2 normal no gallop . Respiratory: No rhonchi or rales are noted . Abdomen: soft . Skin: no rash seen on limited exam . Musculoskeletal: not rigid . Psychiatric:unable to assess . Neurologic: no seizure no involuntary movements         Lab Data:   Basic Metabolic Panel: Recent Labs  Lab 06/25/18 0536  NA 136  K 3.5  CL 99  CO2 27  GLUCOSE 111*  BUN 47*  CREATININE 1.22*  CALCIUM 9.7    Liver Function Tests: No results for input(s): AST, ALT, ALKPHOS, BILITOT, PROT, ALBUMIN in the last 168 hours. No results for input(s): LIPASE, AMYLASE in the last 168 hours. No results for input(s): AMMONIA in the last 168 hours.  CBC: No results for input(s): WBC, NEUTROABS, HGB, HCT, MCV, PLT in the last 168 hours.  Cardiac Enzymes: No results for input(s): CKTOTAL, CKMB, CKMBINDEX, TROPONINI in the last 168 hours.  BNP (last 3 results) No results for input(s): BNP in the last 8760 hours.  ProBNP (last 3 results) No results for input(s): PROBNP in the last 8760  hours.  Radiological Exams: Dg Chest Port 1 View  Result Date: 06/28/2018 CLINICAL DATA:  Respiratory failure EXAM: PORTABLE CHEST 1 VIEW COMPARISON:  06/24/2018 FINDINGS: Tracheostomy again noted in place, unchanged. Heart is borderline in size. Mild vascular congestion. Left base atelectasis or pneumonia, stable. No acute bony abnormality. IMPRESSION: Mild vascular congestion. Continued left base atelectasis or pneumonia, similar to prior study. Electronically Signed   By: Charlett NoseKevin  Dover M.D.   On: 06/28/2018 08:36    Assessment/Plan Principal Problem:   Acute on chronic respiratory failure with hypoxia (HCC) Active Problems:   Severe sepsis with septic shock (HCC)   CKD (chronic kidney disease), stage III (HCC)   Dementia, in, senility, without behavioral disturbance   Aspiration pneumonia of both lower lobes due to gastric secretions (HCC)   1. Acute on chronic respiratory failure with hypoxia continue with weaning protocol the plan is to advance to 2 hours on the T collar today 2. Severe sepsis hemodynamically stable continue monitor 3. CKD stage III stable we will continue present management. 4. Dementia and unchanged 5. Pneumonia due to aspiration follow-up chest x-ray shows mild vascular congestion and some basilar atelectasis   I have personally seen and evaluated the patient, evaluated laboratory and imaging results, formulated the assessment and plan and placed orders. The Patient requires high complexity decision making for assessment and support.  Case was discussed on Rounds with the Respiratory Therapy Staff  Yevonne PaxSaadat A Khan, MD [facility] Pulmonary Critical Care Medicine Sleep  Medicine

## 2018-06-29 DIAGNOSIS — J690 Pneumonitis due to inhalation of food and vomit: Secondary | ICD-10-CM | POA: Diagnosis not present

## 2018-06-29 DIAGNOSIS — J9621 Acute and chronic respiratory failure with hypoxia: Secondary | ICD-10-CM | POA: Diagnosis not present

## 2018-06-29 DIAGNOSIS — F0390 Unspecified dementia without behavioral disturbance: Secondary | ICD-10-CM | POA: Diagnosis not present

## 2018-06-29 DIAGNOSIS — N183 Chronic kidney disease, stage 3 (moderate): Secondary | ICD-10-CM | POA: Diagnosis not present

## 2018-06-29 LAB — POTASSIUM: POTASSIUM: 4 mmol/L (ref 3.5–5.1)

## 2018-06-29 NOTE — Progress Notes (Signed)
[facility]   PULMONARY SERVICE  PROGRESS NOTE  Date of Service: 06/29/2018  Erica Johnson Mooty  ZOX:[id number]  DOB: 05/11/1930   DOA: 06/19/2018  Referring Physician: Carron CurieAli Hijazi, MD  HPI: Erica Johnson Basto is a 84 Johnson.o. female seen for follow up of Acute on Chronic Respiratory Failure.  Doing well has been tolerating the wound.  Patient is supposed to do about 12 hours on T collar today  Medications: Reviewed on Rounds  Physical Exam:  Vitals: Temperature 98.0 pulse 65 respiratory rate 15 blood pressure 99/55 saturations 99%  Ventilator Settings off the ventilator on T collar FiO2 28%  . General: Comfortable at this time . Eyes: Grossly normal lids, irises & conjunctiva . ENT: grossly tongue is normal . Neck: no obvious mass . Cardiovascular: S1 S2 normal no gallop . Respiratory: No rhonchi or rales are noted . Abdomen: soft . Skin: no rash seen on limited exam . Musculoskeletal: not rigid . Psychiatric:unable to assess . Neurologic: no seizure no involuntary movements         Lab Data:   Basic Metabolic Panel: Recent Labs  Lab 06/25/18 0536 06/28/18 0659 06/29/18 0535  NA 136 137  --   K 3.5 3.4* 4.0  CL 99 98  --   CO2 27 29  --   GLUCOSE 111* 105*  --   BUN 47* 63*  --   CREATININE 1.22* 1.62*  --   CALCIUM 9.7 9.9  --     Liver Function Tests: No results for input(s): AST, ALT, ALKPHOS, BILITOT, PROT, ALBUMIN in the last 168 hours. No results for input(s): LIPASE, AMYLASE in the last 168 hours. No results for input(s): AMMONIA in the last 168 hours.  CBC: Recent Labs  Lab 06/28/18 0659  WBC 6.1  HGB 9.4*  HCT 30.6*  MCV 86.9  PLT 403*    Cardiac Enzymes: No results for input(s): CKTOTAL, CKMB, CKMBINDEX, TROPONINI in the last 168 hours.  BNP (last 3 results) No results for input(s): BNP in the last 8760 hours.  ProBNP (last 3 results) No results for input(s): PROBNP in the last 8760  hours.  Radiological Exams: Dg Chest Port 1 View  Result Date: 06/28/2018 CLINICAL DATA:  Respiratory failure EXAM: PORTABLE CHEST 1 VIEW COMPARISON:  06/24/2018 FINDINGS: Tracheostomy again noted in place, unchanged. Heart is borderline in size. Mild vascular congestion. Left base atelectasis or pneumonia, stable. No acute bony abnormality. IMPRESSION: Mild vascular congestion. Continued left base atelectasis or pneumonia, similar to prior study. Electronically Signed   By: Charlett NoseKevin  Dover M.D.   On: 06/28/2018 08:36    Assessment/Plan Principal Problem:   Acute on chronic respiratory failure with hypoxia (HCC) Active Problems:   Severe sepsis with septic shock (HCC)   CKD (chronic kidney disease), stage III (HCC)   Dementia, in, senility, without behavioral disturbance   Aspiration pneumonia of both lower lobes due to gastric secretions (HCC)   1. Acute on chronic respiratory failure with hypoxia we will continue with full support on T collar currently is on 20% FiO2 good saturations are noted.  Secretions are fair to moderate. 2. Severe sepsis resolved we will continue to monitor pressures. 3. Chronic kidney disease stage III stable at this time continue present therapy 4. Dementia at baseline 5. Pneumonia due to aspiration grossly unchanged we will continue with present therapy   I have personally seen and evaluated the patient, evaluated laboratory and imaging results, formulated the assessment and  plan and placed orders. The Patient requires high complexity decision making for assessment and support.  Case was discussed on Rounds with the Respiratory Therapy Staff  Allyne Gee, MD [facility] Pulmonary Critical Care Medicine Sleep Medicine

## 2018-06-30 DIAGNOSIS — N183 Chronic kidney disease, stage 3 (moderate): Secondary | ICD-10-CM | POA: Diagnosis not present

## 2018-06-30 DIAGNOSIS — J9621 Acute and chronic respiratory failure with hypoxia: Secondary | ICD-10-CM | POA: Diagnosis not present

## 2018-06-30 DIAGNOSIS — F0390 Unspecified dementia without behavioral disturbance: Secondary | ICD-10-CM | POA: Diagnosis not present

## 2018-06-30 DIAGNOSIS — J690 Pneumonitis due to inhalation of food and vomit: Secondary | ICD-10-CM | POA: Diagnosis not present

## 2018-06-30 LAB — BASIC METABOLIC PANEL
ANION GAP: 8 (ref 5–15)
BUN: 61 mg/dL — AB (ref 8–23)
CHLORIDE: 101 mmol/L (ref 98–111)
CO2: 30 mmol/L (ref 22–32)
Calcium: 10.2 mg/dL (ref 8.9–10.3)
Creatinine, Ser: 1.63 mg/dL — ABNORMAL HIGH (ref 0.44–1.00)
GFR calc Af Amer: 32 mL/min — ABNORMAL LOW (ref >60)
GFR calc non Af Amer: 27 mL/min — ABNORMAL LOW (ref >60)
GLUCOSE: 105 mg/dL — AB (ref 70–99)
POTASSIUM: 4 mmol/L (ref 3.5–5.1)
SODIUM: 139 mmol/L (ref 135–145)

## 2018-06-30 NOTE — Progress Notes (Signed)
[facility]   PULMONARY SERVICE  PROGRESS NOTE  Date of Service: 06/30/2018  Erica Johnson  [id number]  DOB: 1930/05/23   DOA: 06/19/2018  Referring Physician: Carron CurieAli Hijazi, MD  HPI: Erica Johnson is a 84 y.o. female seen for follow up of Acute on Chronic Respiratory Failure.  She is doing well with the weaning on T collar currently is on 28% FiO2 goal of 16 hours  Medications: Reviewed on Rounds  Physical Exam:  Vitals: Temperature 98.6 pulse 65 respiratory rate 18 blood pressure 120/66 saturations 99%  Ventilator Settings off the ventilator on T collar FiO2 28%  . General: Comfortable at this time . Eyes: Grossly normal lids, irises & conjunctiva . ENT: grossly tongue is normal . Neck: no obvious mass . Cardiovascular: S1 S2 normal no gallop . Respiratory: No rhonchi no rales are noted . Abdomen: soft . Skin: no rash seen on limited exam . Musculoskeletal: not rigid . Psychiatric:unable to assess . Neurologic: no seizure no involuntary movements         Lab Data:   Basic Metabolic Panel: Recent Labs  Lab 06/25/18 0536 06/28/18 0659 06/29/18 0535 06/30/18 0451  NA 136 137  --  139  K 3.5 3.4* 4.0 4.0  CL 99 98  --  101  CO2 27 29  --  30  GLUCOSE 111* 105*  --  105*  BUN 47* 63*  --  61*  CREATININE 1.22* 1.62*  --  1.63*  CALCIUM 9.7 9.9  --  10.2    Liver Function Tests: No results for input(s): AST, ALT, ALKPHOS, BILITOT, PROT, ALBUMIN in the last 168 hours. No results for input(s): LIPASE, AMYLASE in the last 168 hours. No results for input(s): AMMONIA in the last 168 hours.  CBC: Recent Labs  Lab 06/28/18 0659  WBC 6.1  HGB 9.4*  HCT 30.6*  MCV 86.9  PLT 403*    Cardiac Enzymes: No results for input(s): CKTOTAL, CKMB, CKMBINDEX, TROPONINI in the last 168 hours.  BNP (last 3 results) No results for input(s): BNP in the last 8760 hours.  ProBNP (last 3 results) No results for  input(s): PROBNP in the last 8760 hours.  Radiological Exams: No results found.  Assessment/Plan Principal Problem:   Acute on chronic respiratory failure with hypoxia (HCC) Active Problems:   Severe sepsis with septic shock (HCC)   CKD (chronic kidney disease), stage III (HCC)   Dementia, in, senility, without behavioral disturbance   Aspiration pneumonia of both lower lobes due to gastric secretions (HCC)   1. Acute on chronic respiratory failure with hypoxia continues to do well weaning continue to advance. 2. Severe sepsis with shock hemodynamically stable 3. Chronic kidney disease stage III creatinine has been trending upwards we will continue with supportive care primary care following along consider nephrology evaluation 4. Dementia at baseline 5. Pneumonia due to aspiration clinically is improving   I have personally seen and evaluated the patient, evaluated laboratory and imaging results, formulated the assessment and plan and placed orders. The Patient requires high complexity decision making for assessment and support.  Case was discussed on Rounds with the Respiratory Therapy Staff  Yevonne PaxSaadat A Khan, MD [facility] Pulmonary Critical Care Medicine Sleep Medicine

## 2018-07-01 DIAGNOSIS — J9621 Acute and chronic respiratory failure with hypoxia: Secondary | ICD-10-CM | POA: Diagnosis not present

## 2018-07-01 DIAGNOSIS — J690 Pneumonitis due to inhalation of food and vomit: Secondary | ICD-10-CM | POA: Diagnosis not present

## 2018-07-01 DIAGNOSIS — F0390 Unspecified dementia without behavioral disturbance: Secondary | ICD-10-CM | POA: Diagnosis not present

## 2018-07-01 DIAGNOSIS — N183 Chronic kidney disease, stage 3 (moderate): Secondary | ICD-10-CM | POA: Diagnosis not present

## 2018-07-01 NOTE — Progress Notes (Signed)
[facility]ulmonary [facility]   PULMONARY SERVICE  PROGRESS NOTE  Date of Service: 07/01/2018  Erica Johnson  EAV:[id number]:[id number]  DOB: 09-24-1930   DOA: 06/19/2018  Referring Physician: Carron CurieAli Hijazi, MD  HPI: Erica PoissonLoretta Y Johnson is a 84 y.o. female seen for follow up of Acute on Chronic Respiratory Failure.  Patient is weaning goal is 20 hours on T collar so far looks good  Medications: Reviewed on Rounds  Physical Exam:  Vitals: Temperature 98.8 pulse 63 respiratory 28 blood pressure 120/54 saturation 97%  Ventilator Settings off the ventilator on T collar FiO2 28%  . General: Comfortable at this time . Eyes: Grossly normal lids, irises & conjunctiva . ENT: grossly tongue is normal . Neck: no obvious mass . Cardiovascular: S1 S2 normal no gallop . Respiratory: No rhonchi no rales are noted at this time . Abdomen: soft . Skin: no rash seen on limited exam . Musculoskeletal: not rigid . Psychiatric:unable to assess . Neurologic: no seizure no involuntary movements         Lab Data:   Basic Metabolic Panel: Recent Labs  Lab 06/25/18 0536 06/28/18 0659 06/29/18 0535 06/30/18 0451  NA 136 137  --  139  K 3.5 3.4* 4.0 4.0  CL 99 98  --  101  CO2 27 29  --  30  GLUCOSE 111* 105*  --  105*  BUN 47* 63*  --  61*  CREATININE 1.22* 1.62*  --  1.63*  CALCIUM 9.7 9.9  --  10.2    Liver Function Tests: No results for input(s): AST, ALT, ALKPHOS, BILITOT, PROT, ALBUMIN in the last 168 hours. No results for input(s): LIPASE, AMYLASE in the last 168 hours. No results for input(s): AMMONIA in the last 168 hours.  CBC: Recent Labs  Lab 06/28/18 0659  WBC 6.1  HGB 9.4*  HCT 30.6*  MCV 86.9  PLT 403*    Cardiac Enzymes: No results for input(s): CKTOTAL, CKMB, CKMBINDEX, TROPONINI in the last 168 hours.  BNP (last 3 results) No results for input(s): BNP in the last 8760 hours.  ProBNP (last 3 results) No results for input(s): PROBNP  in the last 8760 hours.  Radiological Exams: No results found.  Assessment/Plan Principal Problem:   Acute on chronic respiratory failure with hypoxia (HCC) Active Problems:   Severe sepsis with septic shock (HCC)   CKD (chronic kidney disease), stage III (HCC)   Dementia, in, senility, without behavioral disturbance   Aspiration pneumonia of both lower lobes due to gastric secretions (HCC)   1. Acute on chronic respiratory failure with hypoxia we will continue with weaning on T collar as noted the goal is for about 20 hours.  We will continue pulmonary toilet supportive care. 2. Severe sepsis with shock hemodynamically stable continue present management. 3. CKD stage III follow-up labs 4. Dementia stable 5. Pneumonia due to aspiration treated we will continue to follow x-ray as necessary   I have personally seen and evaluated the patient, evaluated laboratory and imaging results, formulated the assessment and plan and placed orders. The Patient requires high complexity decision making for assessment and support.  Case was discussed on Rounds with the Respiratory Therapy Staff  Yevonne PaxSaadat A Ihan Pat, MD [facility] Pulmonary Critical Care Medicine Sleep Medicine

## 2018-07-02 DIAGNOSIS — F0390 Unspecified dementia without behavioral disturbance: Secondary | ICD-10-CM | POA: Diagnosis not present

## 2018-07-02 DIAGNOSIS — J690 Pneumonitis due to inhalation of food and vomit: Secondary | ICD-10-CM | POA: Diagnosis not present

## 2018-07-02 DIAGNOSIS — N183 Chronic kidney disease, stage 3 (moderate): Secondary | ICD-10-CM | POA: Diagnosis not present

## 2018-07-02 DIAGNOSIS — J9621 Acute and chronic respiratory failure with hypoxia: Secondary | ICD-10-CM | POA: Diagnosis not present

## 2018-07-02 NOTE — Progress Notes (Signed)
[facility]   PULMONARY CRITICAL CARE SERVICE  PROGRESS NOTE  Date of Service: 07/02/2018  Erica PoissonLoretta Y Johnson  ZOX:[id number]  DOB: 1930-03-17   DOA: 06/19/2018  Referring Physician: Carron CurieAli Hijazi, MD  HPI: Erica Johnson is a 84 y.o. female seen for follow up of Acute on Chronic Respiratory Failure.  Patient is doing well has been weaning on T collar the goal was for about 24 hours if she is able to tolerate  Medications: Reviewed on Rounds  Physical Exam:  Vitals: Temperature 98.1 pulse 65 respiratory 24 blood pressure 137/71 saturations 97%  Ventilator Settings off the ventilator on T collar  . General: Comfortable at this time . Eyes: Grossly normal lids, irises & conjunctiva . ENT: grossly tongue is normal . Neck: no obvious mass . Cardiovascular: S1 S2 normal no gallop . Respiratory: Coarse rhonchi no rales . Abdomen: soft . Skin: no rash seen on limited exam . Musculoskeletal: not rigid . Psychiatric:unable to assess . Neurologic: no seizure no involuntary movements         Lab Data:   Basic Metabolic Panel: Recent Labs  Lab 06/28/18 0659 06/29/18 0535 06/30/18 0451  NA 137  --  139  K 3.4* 4.0 4.0  CL 98  --  101  CO2 29  --  30  GLUCOSE 105*  --  105*  BUN 63*  --  61*  CREATININE 1.62*  --  1.63*  CALCIUM 9.9  --  10.2    Liver Function Tests: No results for input(s): AST, ALT, ALKPHOS, BILITOT, PROT, ALBUMIN in the last 168 hours. No results for input(s): LIPASE, AMYLASE in the last 168 hours. No results for input(s): AMMONIA in the last 168 hours.  CBC: Recent Labs  Lab 06/28/18 0659  WBC 6.1  HGB 9.4*  HCT 30.6*  MCV 86.9  PLT 403*    Cardiac Enzymes: No results for input(s): CKTOTAL, CKMB, CKMBINDEX, TROPONINI in the last 168 hours.  BNP (last 3 results) No results for input(s): BNP in the last 8760 hours.  ProBNP (last 3 results) No results for input(s): PROBNP in the last 8760  hours.  Radiological Exams: No results found.  Assessment/Plan Principal Problem:   Acute on chronic respiratory failure with hypoxia (HCC) Active Problems:   Severe sepsis with septic shock (HCC)   CKD (chronic kidney disease), stage III (HCC)   Dementia, in, senility, without behavioral disturbance   Aspiration pneumonia of both lower lobes due to gastric secretions (HCC)   1. Acute on chronic respiratory failure with hypoxia doing well with the wean we will continue to advance as noted above the goal is for 24 hours. 2. Severe sepsis with shock hemodynamically stable continue present management 3. Chronic kidney disease stage III stable we will continue with supportive care 4. Dementia at baseline continue present management 5. Pneumonia due to aspiration treated improved   I have personally seen and evaluated the patient, evaluated laboratory and imaging results, formulated the assessment and plan and placed orders. The Patient requires high complexity decision making for assessment and support.  Case was discussed on Rounds with the Respiratory Therapy Staff  Yevonne PaxSaadat A Makenly Larabee, MD [facility] Pulmonary Critical Care Medicine Sleep Medicine

## 2018-07-03 LAB — BASIC METABOLIC PANEL
ANION GAP: 8 (ref 5–15)
BUN: 43 mg/dL — ABNORMAL HIGH (ref 8–23)
CALCIUM: 10.4 mg/dL — AB (ref 8.9–10.3)
CO2: 29 mmol/L (ref 22–32)
Chloride: 101 mmol/L (ref 98–111)
Creatinine, Ser: 1.21 mg/dL — ABNORMAL HIGH (ref 0.44–1.00)
GFR calc Af Amer: 46 mL/min — ABNORMAL LOW (ref >60)
GFR calc non Af Amer: 39 mL/min — ABNORMAL LOW (ref >60)
Glucose, Bld: 110 mg/dL — ABNORMAL HIGH (ref 70–99)
POTASSIUM: 4.1 mmol/L (ref 3.5–5.1)
Sodium: 138 mmol/L (ref 135–145)

## 2018-07-03 LAB — CBC
HEMATOCRIT: 34.2 % — AB (ref 36.0–46.0)
Hemoglobin: 10.7 g/dL — ABNORMAL LOW (ref 12.0–15.0)
MCH: 27.2 pg (ref 26.0–34.0)
MCHC: 31.3 g/dL (ref 30.0–36.0)
MCV: 86.8 fL (ref 78.0–100.0)
PLATELETS: 318 10*3/uL (ref 150–400)
RBC: 3.94 MIL/uL (ref 3.87–5.11)
RDW: 16 % — AB (ref 11.5–15.5)
WBC: 7.1 10*3/uL (ref 4.0–10.5)

## 2018-07-05 DIAGNOSIS — N183 Chronic kidney disease, stage 3 (moderate): Secondary | ICD-10-CM | POA: Diagnosis not present

## 2018-07-05 DIAGNOSIS — F0390 Unspecified dementia without behavioral disturbance: Secondary | ICD-10-CM | POA: Diagnosis not present

## 2018-07-05 DIAGNOSIS — J9621 Acute and chronic respiratory failure with hypoxia: Secondary | ICD-10-CM | POA: Diagnosis not present

## 2018-07-05 DIAGNOSIS — J690 Pneumonitis due to inhalation of food and vomit: Secondary | ICD-10-CM | POA: Diagnosis not present

## 2018-07-05 NOTE — Progress Notes (Signed)
[facility]   PULMONARY CRITICAL CARE SERVICE  PROGRESS NOTE  Date of Service: 07/05/2018  Erica PoissonLoretta Y Johnson  ZOX:[id number]:[id number]  DOB: 03/22/1930   DOA: 06/19/2018  Referring Physician: Carron CurieAli Hijazi, MD  HPI: Erica Johnson is a 84 y.o. female seen for follow up of Acute on Chronic Respiratory Failure.  Doing well on T collar.  Has been off the ventilator for several days.  Should be able to change the trach today  Medications: Reviewed on Rounds  Physical Exam:  Vitals: Temperature 97.1 pulse 68 respiratory rate 13 blood pressure 147/81 saturations 100%  Ventilator Settings aerosolized T collar FiO2 28%  . General: Comfortable at this time . Eyes: Grossly normal lids, irises & conjunctiva . ENT: grossly tongue is normal . Neck: no obvious mass . Cardiovascular: S1 S2 normal no gallop . Respiratory: No rhonchi no rales are noted . Abdomen: soft . Skin: no rash seen on limited exam . Musculoskeletal: not rigid . Psychiatric:unable to assess . Neurologic: no seizure no involuntary movements         Lab Data:   Basic Metabolic Panel: Recent Labs  Lab 06/29/18 0535 06/30/18 0451 07/03/18 0513  NA  --  139 138  K 4.0 4.0 4.1  CL  --  101 101  CO2  --  30 29  GLUCOSE  --  105* 110*  BUN  --  61* 43*  CREATININE  --  1.63* 1.21*  CALCIUM  --  10.2 10.4*    Liver Function Tests: No results for input(s): AST, ALT, ALKPHOS, BILITOT, PROT, ALBUMIN in the last 168 hours. No results for input(s): LIPASE, AMYLASE in the last 168 hours. No results for input(s): AMMONIA in the last 168 hours.  CBC: Recent Labs  Lab 07/03/18 0513  WBC 7.1  HGB 10.7*  HCT 34.2*  MCV 86.8  PLT 318    Cardiac Enzymes: No results for input(s): CKTOTAL, CKMB, CKMBINDEX, TROPONINI in the last 168 hours.  BNP (last 3 results) No results for input(s): BNP in the last 8760 hours.  ProBNP (last 3 results) No results for input(s): PROBNP  in the last 8760 hours.  Radiological Exams: No results found.  Assessment/Plan Principal Problem:   Acute on chronic respiratory failure with hypoxia (HCC) Active Problems:   Severe sepsis with septic shock (HCC)   CKD (chronic kidney disease), stage III (HCC)   Dementia, in, senility, without behavioral disturbance   Aspiration pneumonia of both lower lobes due to gastric secretions (HCC)   1. Acute on chronic respiratory failure with hypoxia continue with T collar trials will change the trach out to a cuffless #6 today 2. Severe sepsis with shock hemodynamically stable 3. Chronic kidney disease stage III follow labs 4. Dementia at baseline 5. Pneumonia due to aspiration treated we will monitor   I have personally seen and evaluated the patient, evaluated laboratory and imaging results, formulated the assessment and plan and placed orders. The Patient requires high complexity decision making for assessment and support.  Case was discussed on Rounds with the Respiratory Therapy Staff  Yevonne PaxSaadat A Khan, MD [facility] Pulmonary Critical Care Medicine Sleep Medicine

## 2018-07-06 DIAGNOSIS — J9621 Acute and chronic respiratory failure with hypoxia: Secondary | ICD-10-CM | POA: Diagnosis not present

## 2018-07-06 DIAGNOSIS — F0390 Unspecified dementia without behavioral disturbance: Secondary | ICD-10-CM | POA: Diagnosis not present

## 2018-07-06 DIAGNOSIS — N183 Chronic kidney disease, stage 3 (moderate): Secondary | ICD-10-CM | POA: Diagnosis not present

## 2018-07-06 DIAGNOSIS — J690 Pneumonitis due to inhalation of food and vomit: Secondary | ICD-10-CM | POA: Diagnosis not present

## 2018-07-06 LAB — BASIC METABOLIC PANEL
Anion gap: 10 (ref 5–15)
BUN: 45 mg/dL — ABNORMAL HIGH (ref 8–23)
CHLORIDE: 100 mmol/L (ref 98–111)
CO2: 26 mmol/L (ref 22–32)
CREATININE: 1.33 mg/dL — AB (ref 0.44–1.00)
Calcium: 10.5 mg/dL — ABNORMAL HIGH (ref 8.9–10.3)
GFR calc non Af Amer: 35 mL/min — ABNORMAL LOW (ref >60)
GFR, EST AFRICAN AMERICAN: 41 mL/min — AB (ref >60)
Glucose, Bld: 104 mg/dL — ABNORMAL HIGH (ref 70–99)
POTASSIUM: 4.5 mmol/L (ref 3.5–5.1)
SODIUM: 136 mmol/L (ref 135–145)

## 2018-07-06 LAB — CBC
HCT: 33.7 % — ABNORMAL LOW (ref 36.0–46.0)
HEMOGLOBIN: 10.6 g/dL — AB (ref 12.0–15.0)
MCH: 27.4 pg (ref 26.0–34.0)
MCHC: 31.5 g/dL (ref 30.0–36.0)
MCV: 87.1 fL (ref 78.0–100.0)
Platelets: 266 10*3/uL (ref 150–400)
RBC: 3.87 MIL/uL (ref 3.87–5.11)
RDW: 16.9 % — ABNORMAL HIGH (ref 11.5–15.5)
WBC: 8.7 10*3/uL (ref 4.0–10.5)

## 2018-07-06 NOTE — Progress Notes (Signed)
[facility]   PULMONARY CRITICAL CARE SERVICE  PROGRESS NOTE  Date of Service: 07/06/2018  Erica Johnson  NWG:[id number]  DOB: Dec 29, 1929   DOA: 06/19/2018  Referring Physician: Carron CurieAli Hijazi, MD  HPI: Erica Johnson is a 84 y.o. female seen for follow up of Acute on Chronic Respiratory Failure.  Currently on T collar has a cuffless trach in place.  Patient is doing well  Medications: Reviewed on Rounds  Physical Exam:  Vitals: Temperature 97.5 pulse 74 respiratory 21 blood pressure 150/76 saturations 100%  Ventilator Settings off the ventilator on T collar  . General: Comfortable at this time . Eyes: Grossly normal lids, irises & conjunctiva . ENT: grossly tongue is normal . Neck: no obvious mass . Cardiovascular: S1 S2 normal no gallop . Respiratory: No rhonchi no rales are noted . Abdomen: soft . Skin: no rash seen on limited exam . Musculoskeletal: not rigid . Psychiatric:unable to assess . Neurologic: no seizure no involuntary movements         Lab Data:   Basic Metabolic Panel: Recent Labs  Lab 06/30/18 0451 07/03/18 0513 07/06/18 1042  NA 139 138 136  K 4.0 4.1 4.5  CL 101 101 100  CO2 30 29 26   GLUCOSE 105* 110* 104*  BUN 61* 43* 45*  CREATININE 1.63* 1.21* 1.33*  CALCIUM 10.2 10.4* 10.5*    Liver Function Tests: No results for input(s): AST, ALT, ALKPHOS, BILITOT, PROT, ALBUMIN in the last 168 hours. No results for input(s): LIPASE, AMYLASE in the last 168 hours. No results for input(s): AMMONIA in the last 168 hours.  CBC: Recent Labs  Lab 07/03/18 0513 07/06/18 1042  WBC 7.1 8.7  HGB 10.7* 10.6*  HCT 34.2* 33.7*  MCV 86.8 87.1  PLT 318 266    Cardiac Enzymes: No results for input(s): CKTOTAL, CKMB, CKMBINDEX, TROPONINI in the last 168 hours.  BNP (last 3 results) No results for input(s): BNP in the last 8760 hours.  ProBNP (last 3 results) No results for input(s): PROBNP in  the last 8760 hours.  Radiological Exams: No results found.  Assessment/Plan Principal Problem:   Acute on chronic respiratory failure with hypoxia (HCC) Active Problems:   Severe sepsis with septic shock (HCC)   CKD (chronic kidney disease), stage III (HCC)   Dementia, in, senility, without behavioral disturbance   Aspiration pneumonia of both lower lobes due to gastric secretions (HCC)   1. Acute on chronic respiratory failure with hypoxia continues to wean on T collar tolerating a cuffless trach 2. Severe sepsis with shock resolved 3. Chronic kidney disease stage III continue monitoring labs 4. Dementia grossly unchanged stable 5. Pneumonia due to aspiration treated we will continue to follow   I have personally seen and evaluated the patient, evaluated laboratory and imaging results, formulated the assessment and plan and placed orders. The Patient requires high complexity decision making for assessment and support.  Case was discussed on Rounds with the Respiratory Therapy Staff  Yevonne PaxSaadat A Irving Lubbers, MD [facility] Pulmonary Critical Care Medicine Sleep Medicine

## 2018-07-07 DIAGNOSIS — J690 Pneumonitis due to inhalation of food and vomit: Secondary | ICD-10-CM | POA: Diagnosis not present

## 2018-07-07 DIAGNOSIS — J9621 Acute and chronic respiratory failure with hypoxia: Secondary | ICD-10-CM | POA: Diagnosis not present

## 2018-07-07 DIAGNOSIS — F0390 Unspecified dementia without behavioral disturbance: Secondary | ICD-10-CM | POA: Diagnosis not present

## 2018-07-07 DIAGNOSIS — N183 Chronic kidney disease, stage 3 (moderate): Secondary | ICD-10-CM | POA: Diagnosis not present

## 2018-07-07 NOTE — Progress Notes (Signed)
[facility]   PULMONARY CRITICAL CARE SERVICE  PROGRESS NOTE  Date of Service: 07/07/2018  Erica PoissonLoretta Y Johnson  ZOX:[id number]  DOB: 01-10-1930   DOA: 06/19/2018  Referring Physician: Carron CurieAli Hijazi, MD  HPI: Erica Johnson is a 84 y.o. female seen for follow up of Acute on Chronic Respiratory Failure.  She is on T collar currently on 28% FiO2 was tried on the PMV she did not tolerate.  Basically patient has no sensation noted according to the speech therapist  Medications: Reviewed on Rounds  Physical Exam:  Vitals: Temperature 97.4 pulse 78 respiratory 23 blood pressure 120/70 saturations 98%  Ventilator Settings currently is on T collar off the ventilator  . General: Comfortable at this time . Eyes: Grossly normal lids, irises & conjunctiva . ENT: grossly tongue is normal . Neck: no obvious mass . Cardiovascular: S1 S2 normal no gallop . Respiratory: Coarse breath sounds no rhonchi . Abdomen: soft . Skin: no rash seen on limited exam . Musculoskeletal: not rigid . Psychiatric:unable to assess . Neurologic: no seizure no involuntary movements         Lab Data:   Basic Metabolic Panel: Recent Labs  Lab 07/03/18 0513 07/06/18 1042  NA 138 136  K 4.1 4.5  CL 101 100  CO2 29 26  GLUCOSE 110* 104*  BUN 43* 45*  CREATININE 1.21* 1.33*  CALCIUM 10.4* 10.5*    Liver Function Tests: No results for input(s): AST, ALT, ALKPHOS, BILITOT, PROT, ALBUMIN in the last 168 hours. No results for input(s): LIPASE, AMYLASE in the last 168 hours. No results for input(s): AMMONIA in the last 168 hours.  CBC: Recent Labs  Lab 07/03/18 0513 07/06/18 1042  WBC 7.1 8.7  HGB 10.7* 10.6*  HCT 34.2* 33.7*  MCV 86.8 87.1  PLT 318 266    Cardiac Enzymes: No results for input(s): CKTOTAL, CKMB, CKMBINDEX, TROPONINI in the last 168 hours.  BNP (last 3 results) No results for input(s): BNP in the last 8760 hours.  ProBNP (last  3 results) No results for input(s): PROBNP in the last 8760 hours.  Radiological Exams: No results found.  Assessment/Plan Principal Problem:   Acute on chronic respiratory failure with hypoxia (HCC) Active Problems:   Severe sepsis with septic shock (HCC)   CKD (chronic kidney disease), stage III (HCC)   Dementia, in, senility, without behavioral disturbance   Aspiration pneumonia of both lower lobes due to gastric secretions (HCC)   1. Acute on chronic respiratory failure with hypoxia patient is at baseline on T collar.  She does not have good sensation to be able to advance to capping and extubation. 2. Severe sepsis clinically resolved we will monitor 3. Chronic kidney disease stage III at baseline 4. Dementia she is unchanged 5. Pneumonia due to aspiration treated with antibiotics   I have personally seen and evaluated the patient, evaluated laboratory and imaging results, formulated the assessment and plan and placed orders. The Patient requires high complexity decision making for assessment and support.  Case was discussed on Rounds with the Respiratory Therapy Staff  Yevonne PaxSaadat A Lavella Myren, MD [facility] Pulmonary Critical Care Medicine Sleep Medicine

## 2018-07-08 DIAGNOSIS — F0390 Unspecified dementia without behavioral disturbance: Secondary | ICD-10-CM | POA: Diagnosis not present

## 2018-07-08 DIAGNOSIS — J9621 Acute and chronic respiratory failure with hypoxia: Secondary | ICD-10-CM | POA: Diagnosis not present

## 2018-07-08 DIAGNOSIS — N183 Chronic kidney disease, stage 3 (moderate): Secondary | ICD-10-CM | POA: Diagnosis not present

## 2018-07-08 DIAGNOSIS — J690 Pneumonitis due to inhalation of food and vomit: Secondary | ICD-10-CM | POA: Diagnosis not present

## 2018-07-08 NOTE — Progress Notes (Signed)
[facility]   PULMONARY CRITICAL CARE SERVICE  PROGRESS NOTE  Date of Service: 07/08/2018  Erica PoissonLoretta Y Johnson  WUJ:[id number]  DOB: Dec 02, 1929   DOA: 06/19/2018  Referring Physician: Carron CurieAli Hijazi, MD  HPI: Erica Johnson is a 84 y.o. female seen for follow up of Acute on Chronic Respiratory Failure.  Patient is on T collar at this time on 28% FiO2.  Was attempted on PMV did not tolerate  Medications: Reviewed on Rounds  Physical Exam:  Vitals: Temperature 97.3 pulse 60 respiratory rate 19 blood pressure 135/72 saturations 100%  Ventilator Settings off the ventilator on T collar  . General: Comfortable at this time . Eyes: Grossly normal lids, irises & conjunctiva . ENT: grossly tongue is normal . Neck: no obvious mass . Cardiovascular: S1 S2 normal no gallop . Respiratory: Coarse breath sounds bilaterally . Abdomen: soft . Skin: no rash seen on limited exam . Musculoskeletal: not rigid . Psychiatric:unable to assess . Neurologic: no seizure no involuntary movements         Lab Data:   Basic Metabolic Panel: Recent Labs  Lab 07/03/18 0513 07/06/18 1042  NA 138 136  K 4.1 4.5  CL 101 100  CO2 29 26  GLUCOSE 110* 104*  BUN 43* 45*  CREATININE 1.21* 1.33*  CALCIUM 10.4* 10.5*    Liver Function Tests: No results for input(s): AST, ALT, ALKPHOS, BILITOT, PROT, ALBUMIN in the last 168 hours. No results for input(s): LIPASE, AMYLASE in the last 168 hours. No results for input(s): AMMONIA in the last 168 hours.  CBC: Recent Labs  Lab 07/03/18 0513 07/06/18 1042  WBC 7.1 8.7  HGB 10.7* 10.6*  HCT 34.2* 33.7*  MCV 86.8 87.1  PLT 318 266    Cardiac Enzymes: No results for input(s): CKTOTAL, CKMB, CKMBINDEX, TROPONINI in the last 168 hours.  BNP (last 3 results) No results for input(s): BNP in the last 8760 hours.  ProBNP (last 3 results) No results for input(s): PROBNP in the last 8760  hours.  Radiological Exams: No results found.  Assessment/Plan Principal Problem:   Acute on chronic respiratory failure with hypoxia (HCC) Active Problems:   Severe sepsis with septic shock (HCC)   CKD (chronic kidney disease), stage III (HCC)   Dementia, in, senility, without behavioral disturbance   Aspiration pneumonia of both lower lobes due to gastric secretions (HCC)   1. Acute on chronic respiratory failure with hypoxia continue T collar trials continue pulmonary toilet supportive care and continue with secretion management. 2. Severe sepsis hemodynamically stable 3. Chronic kidney disease stage III at baseline 4. Dementia no change 5. Pneumonia due to aspiration clinically improved   I have personally seen and evaluated the patient, evaluated laboratory and imaging results, formulated the assessment and plan and placed orders. The Patient requires high complexity decision making for assessment and support.  Case was discussed on Rounds with the Respiratory Therapy Staff  Yevonne PaxSaadat A Debbera Wolken, MD [facility] Pulmonary Critical Care Medicine Sleep Medicine

## 2018-07-09 DIAGNOSIS — N183 Chronic kidney disease, stage 3 (moderate): Secondary | ICD-10-CM | POA: Diagnosis not present

## 2018-07-09 DIAGNOSIS — F0390 Unspecified dementia without behavioral disturbance: Secondary | ICD-10-CM | POA: Diagnosis not present

## 2018-07-09 DIAGNOSIS — J9621 Acute and chronic respiratory failure with hypoxia: Secondary | ICD-10-CM | POA: Diagnosis not present

## 2018-07-09 DIAGNOSIS — J690 Pneumonitis due to inhalation of food and vomit: Secondary | ICD-10-CM | POA: Diagnosis not present

## 2018-07-09 NOTE — Progress Notes (Signed)
[facility]   PULMONARY CRITICAL CARE SERVICE  PROGRESS NOTE  Date of Service: 07/09/2018  Erica PoissonLoretta Y Lalone  RUE:[id number]:[id number]  DOB: 29-Jul-1930   DOA: 06/19/2018  Referring Physician: Carron CurieAli Hijazi, MD  HPI: Erica Johnson is a 84 y.o. female seen for follow up of Acute on Chronic Respiratory Failure.  Right now is on T collar has been on 28% FiO2 good saturations are noted.  Medications: Reviewed on Rounds  Physical Exam:  Vitals: Temperature 97.7 pulse 66 respiratory rate 22 blood pressure 125/54 saturation 96%  Ventilator Settings on T collar FiO2 28%  . General: Comfortable at this time . Eyes: Grossly normal lids, irises & conjunctiva . ENT: grossly tongue is normal . Neck: no obvious mass . Cardiovascular: S1 S2 normal no gallop . Respiratory: No rhonchi no rales . Abdomen: soft . Skin: no rash seen on limited exam . Musculoskeletal: not rigid . Psychiatric:unable to assess . Neurologic: no seizure no involuntary movements         Lab Data:   Basic Metabolic Panel: Recent Labs  Lab 07/03/18 0513 07/06/18 1042  NA 138 136  K 4.1 4.5  CL 101 100  CO2 29 26  GLUCOSE 110* 104*  BUN 43* 45*  CREATININE 1.21* 1.33*  CALCIUM 10.4* 10.5*    Liver Function Tests: No results for input(s): AST, ALT, ALKPHOS, BILITOT, PROT, ALBUMIN in the last 168 hours. No results for input(s): LIPASE, AMYLASE in the last 168 hours. No results for input(s): AMMONIA in the last 168 hours.  CBC: Recent Labs  Lab 07/03/18 0513 07/06/18 1042  WBC 7.1 8.7  HGB 10.7* 10.6*  HCT 34.2* 33.7*  MCV 86.8 87.1  PLT 318 266    Cardiac Enzymes: No results for input(s): CKTOTAL, CKMB, CKMBINDEX, TROPONINI in the last 168 hours.  BNP (last 3 results) No results for input(s): BNP in the last 8760 hours.  ProBNP (last 3 results) No results for input(s): PROBNP in the last 8760 hours.  Radiological Exams: No results  found.  Assessment/Plan Principal Problem:   Acute on chronic respiratory failure with hypoxia (HCC) Active Problems:   Severe sepsis with septic shock (HCC)   CKD (chronic kidney disease), stage III (HCC)   Dementia, in, senility, without behavioral disturbance   Aspiration pneumonia of both lower lobes due to gastric secretions (HCC)   1. Acute on chronic respiratory failure with hypoxia we will continue with the T collar trials continue on 20% FiO2 patient is at baseline 2. Severe sepsis with shock resolved hemodynamically stable 3. Chronic kidney disease stage III continue to monitor labs 4. Dementia stable unchanged 5. Pneumonia due to aspiration.  Aspiration precautions in place   I have personally seen and evaluated the patient, evaluated laboratory and imaging results, formulated the assessment and plan and placed orders. The Patient requires high complexity decision making for assessment and support.  Case was discussed on Rounds with the Respiratory Therapy Staff  Yevonne PaxSaadat A Khan, MD [facility] Pulmonary Critical Care Medicine Sleep Medicine

## 2018-07-10 DIAGNOSIS — J690 Pneumonitis due to inhalation of food and vomit: Secondary | ICD-10-CM | POA: Diagnosis not present

## 2018-07-10 DIAGNOSIS — F0390 Unspecified dementia without behavioral disturbance: Secondary | ICD-10-CM | POA: Diagnosis not present

## 2018-07-10 DIAGNOSIS — J9621 Acute and chronic respiratory failure with hypoxia: Secondary | ICD-10-CM | POA: Diagnosis not present

## 2018-07-10 DIAGNOSIS — N183 Chronic kidney disease, stage 3 (moderate): Secondary | ICD-10-CM | POA: Diagnosis not present

## 2018-07-10 NOTE — Progress Notes (Signed)
Pulmonary Critical Care Medicine [facility]   PULMONARY CRITICAL CARE SERVICE  PROGRESS NOTE  Date of Service: 07/10/2018  Erica Johnson  ZOX:[id number]  DOB: [birth year]   DOA: 06/19/2018  Referring Physician: Carron CurieAli Hijazi, MD  HPI: Erica Johnson is a 84 y.o. female seen for follow up of Acute on Chronic Respiratory Failure.  Patient is on T collar at baseline.  She is tolerating 20% oxygen without distress  Medications: Reviewed on Rounds  Physical Exam:  Vitals: Temperature 97.0 pulse 59 respiratory rate 18 blood pressure 121/58 saturations 98%  Ventilator Settings on T collar trials right now  . General: Comfortable at this time . Eyes: Grossly normal lids, irises & conjunctiva . ENT: grossly tongue is normal . Neck: no obvious mass . Cardiovascular: S1 S2 normal no gallop . Respiratory: No rhonchi no rales are noted . Abdomen: soft . Skin: no rash seen on limited exam . Musculoskeletal: not rigid . Psychiatric:unable to assess . Neurologic: no seizure no involuntary movements         Lab Data:   Basic Metabolic Panel: Recent Labs  Lab 07/06/18 1042  NA 136  K 4.5  CL 100  CO2 26  GLUCOSE 104*  BUN 45*  CREATININE 1.33*  CALCIUM 10.5*    Liver Function Tests: No results for input(s): AST, ALT, ALKPHOS, BILITOT, PROT, ALBUMIN in the last 168 hours. No results for input(s): LIPASE, AMYLASE in the last 168 hours. No results for input(s): AMMONIA in the last 168 hours.  CBC: Recent Labs  Lab 07/06/18 1042  WBC 8.7  HGB 10.6*  HCT 33.7*  MCV 87.1  PLT 266    Cardiac Enzymes: No results for input(s): CKTOTAL, CKMB, CKMBINDEX, TROPONINI in the last 168 hours.  BNP (last 3 results) No results for input(s): BNP in the last 8760 hours.  ProBNP (last 3 results) No results for input(s): PROBNP in the last 8760 hours.  Radiological Exams: No results found.  Assessment/Plan Principal Problem:   Acute on chronic respiratory  failure with hypoxia (HCC) Active Problems:   Severe sepsis with septic shock (HCC)   CKD (chronic kidney disease), stage III (HCC)   Dementia, in, senility, without behavioral disturbance   Aspiration pneumonia of both lower lobes due to gastric secretions (HCC)   1. Acute on chronic respiratory failure with hypoxia we will continue with full support on T collar.  Continue aggressive pulmonary toilet.  She is not able to be decannulated. 2. Severe sepsis resolved we will continue to monitor pressures. 3. Chronic kidney disease stage III at baseline we will continue supportive care 4. Dementia baseline 5. Aspiration pneumonia treated clinically improved   I have personally seen and evaluated the patient, evaluated laboratory and imaging results, formulated the assessment and plan and placed orders. The Patient requires high complexity decision making for assessment and support.  Case was discussed on Rounds with the Respiratory Therapy Staff  Yevonne PaxSaadat A , MD [facility] Pulmonary Critical Care Medicine Sleep Medicine

## 2018-07-11 DIAGNOSIS — N183 Chronic kidney disease, stage 3 (moderate): Secondary | ICD-10-CM | POA: Diagnosis not present

## 2018-07-11 DIAGNOSIS — J690 Pneumonitis due to inhalation of food and vomit: Secondary | ICD-10-CM | POA: Diagnosis not present

## 2018-07-11 DIAGNOSIS — F0390 Unspecified dementia without behavioral disturbance: Secondary | ICD-10-CM | POA: Diagnosis not present

## 2018-07-11 DIAGNOSIS — J9621 Acute and chronic respiratory failure with hypoxia: Secondary | ICD-10-CM | POA: Diagnosis not present

## 2018-07-11 NOTE — Progress Notes (Signed)
Pulmonary Critical Care Medicine [facility] [place]   PULMONARY CRITICAL CARE SERVICE  PROGRESS NOTE  Date of Service: 07/11/2018  Erica Johnson  [id number]  DOB: Jan 29, 1930   DOA: 06/19/2018  Referring Physician: Carron Curie, MD  HPI: Erica Johnson is a 84 y.o. female seen for follow up of Acute on Chronic Respiratory Failure.  At this time patient is on T collar she is at her baseline comfortable no distress noted.  Medications: Reviewed on Rounds  Physical Exam:  Vitals: Temperature 97.7 pulse 70 respiratory rate 18 blood pressure 134/65 saturations 100%  Ventilator Settings currently is off the ventilator on T collar  . General: Comfortable at this time . Eyes: Grossly normal lids, irises & conjunctiva . ENT: grossly tongue is normal . Neck: no obvious mass . Cardiovascular: S1 S2 normal no gallop . Respiratory: Scattered rhonchi . Abdomen: soft . Skin: no rash seen on limited exam . Musculoskeletal: not rigid . Psychiatric:unable to assess . Neurologic: no seizure no involuntary movements         Lab Data:   Basic Metabolic Panel: Recent Labs  Lab 07/06/18 1042  NA 136  K 4.5  CL 100  CO2 26  GLUCOSE 104*  BUN 45*  CREATININE 1.33*  CALCIUM 10.5*    ABG: No results for input(s): PHART, PCO2ART, PO2ART, HCO3, O2SAT in the last 168 hours.  Liver Function Tests: No results for input(s): AST, ALT, ALKPHOS, BILITOT, PROT, ALBUMIN in the last 168 hours. No results for input(s): LIPASE, AMYLASE in the last 168 hours. No results for input(s): AMMONIA in the last 168 hours.  CBC: Recent Labs  Lab 07/06/18 1042  WBC 8.7  HGB 10.6*  HCT 33.7*  MCV 87.1  PLT 266    Cardiac Enzymes: No results for input(s): CKTOTAL, CKMB, CKMBINDEX, TROPONINI in the last 168 hours.  BNP (last 3 results) No results for input(s): BNP in the last 8760 hours.  ProBNP (last 3 results) No results for input(s): PROBNP in the last 8760  hours.  Radiological Exams: No results found.  Assessment/Plan Principal Problem:   Acute on chronic respiratory failure with hypoxia (HCC) Active Problems:   Severe sepsis with septic shock (HCC)   CKD (chronic kidney disease), stage III (HCC)   Dementia, in, senility, without behavioral disturbance   Aspiration pneumonia of both lower lobes due to gastric secretions (HCC)   1. Acute on chronic respiratory failure with hypoxia continue with the T collar wean as tolerated patient is at baseline 2. Dementia grossly unchanged 3. Pneumonia 2 respirations stable 4. Severe sepsis hemodynamically stable 5. Chronic kidney disease stage III labs have been stable we will monitor   I have personally seen and evaluated the patient, evaluated laboratory and imaging results, formulated the assessment and plan and placed orders. The Patient requires high complexity decision making for assessment and support.  Case was discussed on Rounds with the Respiratory Therapy Staff  Yevonne Pax, MD [facility] Pulmonary Critical Care Medicine Sleep Medicine

## 2018-07-12 DIAGNOSIS — J690 Pneumonitis due to inhalation of food and vomit: Secondary | ICD-10-CM | POA: Diagnosis not present

## 2018-07-12 DIAGNOSIS — N183 Chronic kidney disease, stage 3 (moderate): Secondary | ICD-10-CM | POA: Diagnosis not present

## 2018-07-12 DIAGNOSIS — F0390 Unspecified dementia without behavioral disturbance: Secondary | ICD-10-CM | POA: Diagnosis not present

## 2018-07-12 DIAGNOSIS — J9621 Acute and chronic respiratory failure with hypoxia: Secondary | ICD-10-CM | POA: Diagnosis not present

## 2018-07-12 NOTE — Progress Notes (Signed)
Pulmonary Critical Care Medicine [facility] [place]   PULMONARY CRITICAL CARE SERVICE  PROGRESS NOTE  Date of Service: 07/12/2018  Erica Johnson  [id number]  DOB: 10-23-1930   DOA: 06/19/2018  Referring Physician: Carron Curie, MD  HPI: Erica Johnson is a 84 y.o. female seen for follow up of Acute on Chronic Respiratory Failure.  She is at baseline on T collar comfortable without distress remains on 28% oxygen.  Medications: Reviewed on Rounds  Physical Exam:  Vitals: Temperature 97.8 pulse 61 respiratory rate 22 blood pressure 122/58 saturations 100%  Ventilator Settings off the ventilator on T collar FiO2 28%  . General: Comfortable at this time . Eyes: Grossly normal lids, irises & conjunctiva . ENT: grossly tongue is normal . Neck: no obvious mass . Cardiovascular: S1 S2 normal no gallop . Respiratory: Few rhonchi no rales are noted . Abdomen: soft . Skin: no rash seen on limited exam . Musculoskeletal: not rigid . Psychiatric:unable to assess . Neurologic: no seizure no involuntary movements         Lab Data:   Basic Metabolic Panel: Recent Labs  Lab 07/06/18 1042  NA 136  K 4.5  CL 100  CO2 26  GLUCOSE 104*  BUN 45*  CREATININE 1.33*  CALCIUM 10.5*    ABG: No results for input(s): PHART, PCO2ART, PO2ART, HCO3, O2SAT in the last 168 hours.  Liver Function Tests: No results for input(s): AST, ALT, ALKPHOS, BILITOT, PROT, ALBUMIN in the last 168 hours. No results for input(s): LIPASE, AMYLASE in the last 168 hours. No results for input(s): AMMONIA in the last 168 hours.  CBC: Recent Labs  Lab 07/06/18 1042  WBC 8.7  HGB 10.6*  HCT 33.7*  MCV 87.1  PLT 266    Cardiac Enzymes: No results for input(s): CKTOTAL, CKMB, CKMBINDEX, TROPONINI in the last 168 hours.  BNP (last 3 results) No results for input(s): BNP in the last 8760 hours.  ProBNP (last 3 results) No results for input(s): PROBNP in the last 8760  hours.  Radiological Exams: No results found.  Assessment/Plan Principal Problem:   Acute on chronic respiratory failure with hypoxia (HCC) Active Problems:   Severe sepsis with septic shock (HCC)   CKD (chronic kidney disease), stage III (HCC)   Dementia, in, senility, without behavioral disturbance   Aspiration pneumonia of both lower lobes due to gastric secretions (HCC)   1. Acute on chronic respiratory failure with hypoxia continue with T, continue aggressive pulmonary toilet. 2. Severe sepsis stable 3. Chronic kidney disease stage III at baseline follow labs 4. Dementia unchanged not verbal. 5. Pneumonia due to aspiration treated   I have personally seen and evaluated the patient, evaluated laboratory and imaging results, formulated the assessment and plan and placed orders. The Patient requires high complexity decision making for assessment and support.  Case was discussed on Rounds with the Respiratory Therapy Staff  Yevonne Pax, MD [facility] Pulmonary Critical Care Medicine Sleep Medicine

## 2018-07-13 DIAGNOSIS — J9621 Acute and chronic respiratory failure with hypoxia: Secondary | ICD-10-CM | POA: Diagnosis not present

## 2018-07-13 DIAGNOSIS — N183 Chronic kidney disease, stage 3 (moderate): Secondary | ICD-10-CM | POA: Diagnosis not present

## 2018-07-13 DIAGNOSIS — F0390 Unspecified dementia without behavioral disturbance: Secondary | ICD-10-CM | POA: Diagnosis not present

## 2018-07-13 DIAGNOSIS — J690 Pneumonitis due to inhalation of food and vomit: Secondary | ICD-10-CM | POA: Diagnosis not present

## 2018-07-13 NOTE — Progress Notes (Signed)
Pulmonary Critical Care Medicine [facility] [place]   PULMONARY CRITICAL CARE SERVICE  PROGRESS NOTE  Date of Service: 07/13/2018  Erica Johnson  [id number]  DOB: 22-Dec-1929   DOA: 06/19/2018  Referring Physician: Carron Curie, MD  HPI: Erica Johnson is a 84 y.o. female seen for follow up of Acute on Chronic Respiratory Failure.  Patient is on T collar at baseline comfortable no distress at this time.  Medications: Reviewed on Rounds  Physical Exam:  Vitals: Temperature 97.6 pulse 68 respiratory rate 22 blood pressure 131/67 saturations 100%  Ventilator Settings currently off the ventilator on T collar  . General: Comfortable at this time . Eyes: Grossly normal lids, irises & conjunctiva . ENT: grossly tongue is normal . Neck: no obvious mass . Cardiovascular: S1 S2 normal no gallop . Respiratory: No rhonchi no rales . Abdomen: soft . Skin: no rash seen on limited exam . Musculoskeletal: not rigid . Psychiatric:unable to assess . Neurologic: no seizure no involuntary movements         Lab Data:   Basic Metabolic Panel: No results for input(s): NA, K, CL, CO2, GLUCOSE, BUN, CREATININE, CALCIUM, MG, PHOS in the last 168 hours.  ABG: No results for input(s): PHART, PCO2ART, PO2ART, HCO3, O2SAT in the last 168 hours.  Liver Function Tests: No results for input(s): AST, ALT, ALKPHOS, BILITOT, PROT, ALBUMIN in the last 168 hours. No results for input(s): LIPASE, AMYLASE in the last 168 hours. No results for input(s): AMMONIA in the last 168 hours.  CBC: No results for input(s): WBC, NEUTROABS, HGB, HCT, MCV, PLT in the last 168 hours.  Cardiac Enzymes: No results for input(s): CKTOTAL, CKMB, CKMBINDEX, TROPONINI in the last 168 hours.  BNP (last 3 results) No results for input(s): BNP in the last 8760 hours.  ProBNP (last 3 results) No results for input(s): PROBNP in the last 8760 hours.  Radiological Exams: No results  found.  Assessment/Plan Principal Problem:   Acute on chronic respiratory failure with hypoxia (HCC) Active Problems:   Severe sepsis with septic shock (HCC)   CKD (chronic kidney disease), stage III (HCC)   Dementia, in, senility, without behavioral disturbance   Aspiration pneumonia of both lower lobes due to gastric secretions (HCC)   1. Acute on chronic respiratory failure with hypoxia continue with full support on T collar continue pulmonary toilet secretion management. 2. Severe sepsis resolved 3. Chronic kidney disease resolved creatinine back to baseline. 4. Dementia unchanged 5. Pneumonia due to aspiration treated we will continue to follow   I have personally seen and evaluated the patient, evaluated laboratory and imaging results, formulated the assessment and plan and placed orders. The Patient requires high complexity decision making for assessment and support.  Case was discussed on Rounds with the Respiratory Therapy Staff  Yevonne Pax, MD [facility] Pulmonary Critical Care Medicine Sleep Medicine

## 2018-07-14 DIAGNOSIS — N183 Chronic kidney disease, stage 3 (moderate): Secondary | ICD-10-CM | POA: Diagnosis not present

## 2018-07-14 DIAGNOSIS — J9621 Acute and chronic respiratory failure with hypoxia: Secondary | ICD-10-CM | POA: Diagnosis not present

## 2018-07-14 DIAGNOSIS — J690 Pneumonitis due to inhalation of food and vomit: Secondary | ICD-10-CM | POA: Diagnosis not present

## 2018-07-14 DIAGNOSIS — F0390 Unspecified dementia without behavioral disturbance: Secondary | ICD-10-CM | POA: Diagnosis not present

## 2018-07-14 NOTE — Progress Notes (Signed)
Pulmonary Critical Care Medicine [facility] [place]   PULMONARY CRITICAL CARE SERVICE  PROGRESS NOTE  Date of Service: 07/14/2018  Erica Johnson  [id number]  DOB: [birth year]   DOA: 06/19/2018  Referring Physician: Carron Curie, MD  HPI: Erica Johnson is a 84 y.o. female seen for follow up of Acute on Chronic Respiratory Failure.  Patient is on T collar and percent FiO2 scheduled for discharge this week  Medications: Reviewed on Rounds  Physical Exam:  Vitals: Temperature 98.0 pulse 68 respiratory 19 blood pressure 93/46 saturation 97%  Ventilator Settings off the ventilator on T collar  . General: Comfortable at this time . Eyes: Grossly normal lids, irises & conjunctiva . ENT: grossly tongue is normal . Neck: no obvious mass . Cardiovascular: S1 S2 normal no gallop . Respiratory: Scattered rhonchi . Abdomen: soft . Skin: no rash seen on limited exam . Musculoskeletal: not rigid . Psychiatric:unable to assess . Neurologic: no seizure no involuntary movements         Lab Data:   Basic Metabolic Panel: No results for input(s): NA, K, CL, CO2, GLUCOSE, BUN, CREATININE, CALCIUM, MG, PHOS in the last 168 hours.  ABG: No results for input(s): PHART, PCO2ART, PO2ART, HCO3, O2SAT in the last 168 hours.  Liver Function Tests: No results for input(s): AST, ALT, ALKPHOS, BILITOT, PROT, ALBUMIN in the last 168 hours. No results for input(s): LIPASE, AMYLASE in the last 168 hours. No results for input(s): AMMONIA in the last 168 hours.  CBC: No results for input(s): WBC, NEUTROABS, HGB, HCT, MCV, PLT in the last 168 hours.  Cardiac Enzymes: No results for input(s): CKTOTAL, CKMB, CKMBINDEX, TROPONINI in the last 168 hours.  BNP (last 3 results) No results for input(s): BNP in the last 8760 hours.  ProBNP (last 3 results) No results for input(s): PROBNP in the last 8760 hours.  Radiological Exams: No results found.  Assessment/Plan Principal  Problem:   Acute on chronic respiratory failure with hypoxia (HCC) Active Problems:   Severe sepsis with septic shock (HCC)   CKD (chronic kidney disease), stage III (HCC)   Dementia, in, senility, without behavioral disturbance   Aspiration pneumonia of both lower lobes due to gastric secretions (HCC)   1. Acute on chronic respiratory failure with hypoxia continue with T collar trials continue pulmonary toilet supportive care. 2. Severe sepsis with shock hemodynamically stable 3. Chronic kidney disease at baseline 4. Dementia stable 5. Pneumonia due to aspiration treated   I have personally seen and evaluated the patient, evaluated laboratory and imaging results, formulated the assessment and plan and placed orders. The Patient requires high complexity decision making for assessment and support.  Case was discussed on Rounds with the Respiratory Therapy Staff  Yevonne Pax, MD [facility] Pulmonary Critical Care Medicine Sleep Medicine

## 2018-07-15 DIAGNOSIS — F0390 Unspecified dementia without behavioral disturbance: Secondary | ICD-10-CM | POA: Diagnosis not present

## 2018-07-15 DIAGNOSIS — J9621 Acute and chronic respiratory failure with hypoxia: Secondary | ICD-10-CM | POA: Diagnosis not present

## 2018-07-15 DIAGNOSIS — J690 Pneumonitis due to inhalation of food and vomit: Secondary | ICD-10-CM | POA: Diagnosis not present

## 2018-07-15 DIAGNOSIS — N183 Chronic kidney disease, stage 3 (moderate): Secondary | ICD-10-CM | POA: Diagnosis not present

## 2018-07-15 LAB — BASIC METABOLIC PANEL
Anion gap: 8 (ref 5–15)
BUN: 48 mg/dL — ABNORMAL HIGH (ref 8–23)
CALCIUM: 11.1 mg/dL — AB (ref 8.9–10.3)
CO2: 26 mmol/L (ref 22–32)
CREATININE: 1.07 mg/dL — AB (ref 0.44–1.00)
Chloride: 103 mmol/L (ref 98–111)
GFR, EST AFRICAN AMERICAN: 53 mL/min — AB (ref >60)
GFR, EST NON AFRICAN AMERICAN: 46 mL/min — AB (ref >60)
Glucose, Bld: 99 mg/dL (ref 70–99)
Potassium: 4 mmol/L (ref 3.5–5.1)
SODIUM: 137 mmol/L (ref 135–145)

## 2018-07-15 LAB — CBC
HCT: 33.1 % — ABNORMAL LOW (ref 36.0–46.0)
Hemoglobin: 10.3 g/dL — ABNORMAL LOW (ref 12.0–15.0)
MCH: 27.5 pg (ref 26.0–34.0)
MCHC: 31.1 g/dL (ref 30.0–36.0)
MCV: 88.3 fL (ref 78.0–100.0)
PLATELETS: 255 10*3/uL (ref 150–400)
RBC: 3.75 MIL/uL — ABNORMAL LOW (ref 3.87–5.11)
RDW: 17.5 % — AB (ref 11.5–15.5)
WBC: 5.8 10*3/uL (ref 4.0–10.5)

## 2018-07-15 NOTE — Progress Notes (Signed)
Pulmonary Critical Care Medicine [facility] [place]   PULMONARY CRITICAL CARE SERVICE  PROGRESS NOTE  Date of Service: 07/15/2018  Erica Johnson  [id number]  DOB: November 02, 1930   DOA: 06/19/2018  Referring Physician: Carron Curie, MD  HPI: Erica Johnson is a 84 y.o. female seen for follow up of Acute on Chronic Respiratory Failure.  Remains on T collar doing fairly well patient is on 28% oxygen awaiting discharge planning  Medications: Reviewed on Rounds  Physical Exam:  Vitals: Temperature is 97.6 pulse 80 respiratory rate 16 blood pressure 152/85 saturations 100%  Ventilator Settings off the ventilator on T collar  . General: Comfortable at this time . Eyes: Grossly normal lids, irises & conjunctiva . ENT: grossly tongue is normal . Neck: no obvious mass . Cardiovascular: S1 S2 normal no gallop . Respiratory: No rhonchi or rales are noted . Abdomen: soft . Skin: no rash seen on limited exam . Musculoskeletal: not rigid . Psychiatric:unable to assess . Neurologic: no seizure no involuntary movements         Lab Data:   Basic Metabolic Panel: Recent Labs  Lab 07/15/18 0501  NA 137  K 4.0  CL 103  CO2 26  GLUCOSE 99  BUN 48*  CREATININE 1.07*  CALCIUM 11.1*    ABG: No results for input(s): PHART, PCO2ART, PO2ART, HCO3, O2SAT in the last 168 hours.  Liver Function Tests: No results for input(s): AST, ALT, ALKPHOS, BILITOT, PROT, ALBUMIN in the last 168 hours. No results for input(s): LIPASE, AMYLASE in the last 168 hours. No results for input(s): AMMONIA in the last 168 hours.  CBC: Recent Labs  Lab 07/15/18 0501  WBC 5.8  HGB 10.3*  HCT 33.1*  MCV 88.3  PLT 255    Cardiac Enzymes: No results for input(s): CKTOTAL, CKMB, CKMBINDEX, TROPONINI in the last 168 hours.  BNP (last 3 results) No results for input(s): BNP in the last 8760 hours.  ProBNP (last 3 results) No results for input(s): PROBNP in the last 8760  hours.  Radiological Exams: No results found.  Assessment/Plan Principal Problem:   Acute on chronic respiratory failure with hypoxia (HCC) Active Problems:   Severe sepsis with septic shock (HCC)   CKD (chronic kidney disease), stage III (HCC)   Dementia, in, senility, without behavioral disturbance   Aspiration pneumonia of both lower lobes due to gastric secretions (HCC)   1. Acute on chronic respiratory failure with hypoxia patient currently is on T collar trials will continue secretion management pulmonary toilet 2. Severe sepsis resolved 3. Chronic kidney disease stage III labs have been stable 4. Dementia unchanged we will continue with supportive care 5. Aspiration pneumonia resolved   I have personally seen and evaluated the patient, evaluated laboratory and imaging results, formulated the assessment and plan and placed orders. The Patient requires high complexity decision making for assessment and support.  Case was discussed on Rounds with the Respiratory Therapy Staff  Yevonne Pax, MD [facility] Pulmonary Critical Care Medicine Sleep Medicine

## 2018-07-16 DIAGNOSIS — J690 Pneumonitis due to inhalation of food and vomit: Secondary | ICD-10-CM | POA: Diagnosis not present

## 2018-07-16 DIAGNOSIS — N183 Chronic kidney disease, stage 3 (moderate): Secondary | ICD-10-CM | POA: Diagnosis not present

## 2018-07-16 DIAGNOSIS — J9621 Acute and chronic respiratory failure with hypoxia: Secondary | ICD-10-CM | POA: Diagnosis not present

## 2018-07-16 DIAGNOSIS — F0390 Unspecified dementia without behavioral disturbance: Secondary | ICD-10-CM | POA: Diagnosis not present

## 2018-07-16 NOTE — Progress Notes (Signed)
Pulmonary Critical Care Medicine [facility] [place]   PULMONARY CRITICAL CARE SERVICE  PROGRESS NOTE  Date of Service: 07/16/2018  Erica Johnson  [id number]  DOB: 1930/03/01   DOA: 06/19/2018  Referring Physician: Carron Curie, MD  HPI: Erica Johnson is a 84 y.o. female seen for follow up of Acute on Chronic Respiratory Failure.  Is at baseline on T collar right now currently is on 28% oxygen  Medications: Reviewed on Rounds  Physical Exam:  Vitals: Temperature 97.2 pulse 75 respiratory 17 blood pressure 140/74 saturations 100%  Ventilator Settings off the ventilator on T collar  . General: Comfortable at this time . Eyes: Grossly normal lids, irises & conjunctiva . ENT: grossly tongue is normal . Neck: no obvious mass . Cardiovascular: S1 S2 normal no gallop . Respiratory: Coarse rhonchi noted . Abdomen: soft . Skin: no rash seen on limited exam . Musculoskeletal: not rigid . Psychiatric:unable to assess . Neurologic: no seizure no involuntary movements         Lab Data:   Basic Metabolic Panel: Recent Labs  Lab 07/15/18 0501  NA 137  K 4.0  CL 103  CO2 26  GLUCOSE 99  BUN 48*  CREATININE 1.07*  CALCIUM 11.1*    ABG: No results for input(s): PHART, PCO2ART, PO2ART, HCO3, O2SAT in the last 168 hours.  Liver Function Tests: No results for input(s): AST, ALT, ALKPHOS, BILITOT, PROT, ALBUMIN in the last 168 hours. No results for input(s): LIPASE, AMYLASE in the last 168 hours. No results for input(s): AMMONIA in the last 168 hours.  CBC: Recent Labs  Lab 07/15/18 0501  WBC 5.8  HGB 10.3*  HCT 33.1*  MCV 88.3  PLT 255    Cardiac Enzymes: No results for input(s): CKTOTAL, CKMB, CKMBINDEX, TROPONINI in the last 168 hours.  BNP (last 3 results) No results for input(s): BNP in the last 8760 hours.  ProBNP (last 3 results) No results for input(s): PROBNP in the last 8760 hours.  Radiological Exams: No results  found.  Assessment/Plan Principal Problem:   Acute on chronic respiratory failure with hypoxia (HCC) Active Problems:   Severe sepsis with septic shock (HCC)   CKD (chronic kidney disease), stage III (HCC)   Dementia, in, senility, without behavioral disturbance   Aspiration pneumonia of both lower lobes due to gastric secretions (HCC)   1. Acute on chronic respiratory failure with hypoxia we will continue with T collar as ordered.  Continue supportive care 2. Severe sepsis with shock hemodynamically stable continue with present therapy. 3. Chronic kidney disease stage III follow-up labs 4. Dementia unchanged 5. Aspiration pneumonia treated   I have personally seen and evaluated the patient, evaluated laboratory and imaging results, formulated the assessment and plan and placed orders. The Patient requires high complexity decision making for assessment and support.  Case was discussed on Rounds with the Respiratory Therapy Staff  Yevonne Pax, MD [facility] Pulmonary Critical Care Medicine Sleep Medicine

## 2018-07-17 DIAGNOSIS — J9621 Acute and chronic respiratory failure with hypoxia: Secondary | ICD-10-CM | POA: Diagnosis not present

## 2018-07-17 DIAGNOSIS — F0390 Unspecified dementia without behavioral disturbance: Secondary | ICD-10-CM | POA: Diagnosis not present

## 2018-07-17 DIAGNOSIS — J690 Pneumonitis due to inhalation of food and vomit: Secondary | ICD-10-CM | POA: Diagnosis not present

## 2018-07-17 DIAGNOSIS — N183 Chronic kidney disease, stage 3 (moderate): Secondary | ICD-10-CM | POA: Diagnosis not present

## 2018-07-17 NOTE — Progress Notes (Signed)
Pulmonary Critical Care Medicine [facility] [place]   PULMONARY CRITICAL CARE SERVICE  PROGRESS NOTE  Date of Service: 07/17/2018  Erica Johnson  [id number]  DOB: [birth year]   DOA: 06/19/2018  Referring Physician: Carron Curie, MD  HPI: Erica Johnson is a 84 y.o. female seen for follow up of Acute on Chronic Respiratory Failure.  Patient is comfortable without distress she is on T collar at this time  Medications: Reviewed on Rounds  Physical Exam:  Vitals: Temperature 97.1 pulse 60 respiratory rate 18 blood pressure 104 63 saturations 98%  Ventilator Settings off the ventilator on T collar  . General: Comfortable at this time . Eyes: Grossly normal lids, irises & conjunctiva . ENT: grossly tongue is normal . Neck: no obvious mass . Cardiovascular: S1 S2 normal no gallop . Respiratory: No rhonchi no rales are noted . Abdomen: soft . Skin: no rash seen on limited exam . Musculoskeletal: not rigid . Psychiatric:unable to assess . Neurologic: no seizure no involuntary movements         Lab Data:   Basic Metabolic Panel: Recent Labs  Lab 07/15/18 0501  NA 137  K 4.0  CL 103  CO2 26  GLUCOSE 99  BUN 48*  CREATININE 1.07*  CALCIUM 11.1*    ABG: No results for input(s): PHART, PCO2ART, PO2ART, HCO3, O2SAT in the last 168 hours.  Liver Function Tests: No results for input(s): AST, ALT, ALKPHOS, BILITOT, PROT, ALBUMIN in the last 168 hours. No results for input(s): LIPASE, AMYLASE in the last 168 hours. No results for input(s): AMMONIA in the last 168 hours.  CBC: Recent Labs  Lab 07/15/18 0501  WBC 5.8  HGB 10.3*  HCT 33.1*  MCV 88.3  PLT 255    Cardiac Enzymes: No results for input(s): CKTOTAL, CKMB, CKMBINDEX, TROPONINI in the last 168 hours.  BNP (last 3 results) No results for input(s): BNP in the last 8760 hours.  ProBNP (last 3 results) No results for input(s): PROBNP in the last 8760 hours.  Radiological Exams: No  results found.  Assessment/Plan Principal Problem:   Acute on chronic respiratory failure with hypoxia (HCC) Active Problems:   Severe sepsis with septic shock (HCC)   CKD (chronic kidney disease), stage III (HCC)   Dementia, in, senility, without behavioral disturbance   Aspiration pneumonia of both lower lobes due to gastric secretions (HCC)   1. Acute on chronic respiratory failure with hypoxia continue with T collar trials continue pulmonary toilet supportive care. 2. Severe sepsis with shock hemodynamically stable continue present management. 3. Chronic kidney disease follow-up labs 4. Dementia unchanged 5. Pneumonia due to aspiration clinically resolved   I have personally seen and evaluated the patient, evaluated laboratory and imaging results, formulated the assessment and plan and placed orders. The Patient requires high complexity decision making for assessment and support.  Case was discussed on Rounds with the Respiratory Therapy Staff  Yevonne Pax, MD [facility] Pulmonary Critical Care Medicine Sleep Medicine

## 2018-07-18 DIAGNOSIS — J9621 Acute and chronic respiratory failure with hypoxia: Secondary | ICD-10-CM | POA: Diagnosis not present

## 2018-07-18 DIAGNOSIS — J690 Pneumonitis due to inhalation of food and vomit: Secondary | ICD-10-CM | POA: Diagnosis not present

## 2018-07-18 DIAGNOSIS — N183 Chronic kidney disease, stage 3 (moderate): Secondary | ICD-10-CM | POA: Diagnosis not present

## 2018-07-18 DIAGNOSIS — F0390 Unspecified dementia without behavioral disturbance: Secondary | ICD-10-CM | POA: Diagnosis not present

## 2018-07-18 NOTE — Progress Notes (Signed)
Pulmonary Critical Care Medicine [facility] [place]   PULMONARY CRITICAL CARE SERVICE  PROGRESS NOTE  Date of Service: 07/18/2018  Erica Johnson  [id number]  DOB: 1930/01/05   DOA: 06/19/2018  Referring Physician: Carron Curie, MD  HPI: Erica Johnson is a 84 y.o. female seen for follow up of Acute on Chronic Respiratory Failure.  Comfortable no distress remains on the T collar at this time  Medications: Reviewed on Rounds  Physical Exam:  Vitals: Temperature 97.2 pulse 78 respiratory rate 24 blood pressure 152/80 saturations 100%  Ventilator Settings off the ventilator on T collar  . General: Comfortable at this time . Eyes: Grossly normal lids, irises & conjunctiva . ENT: grossly tongue is normal . Neck: no obvious mass . Cardiovascular: S1 S2 normal no gallop . Respiratory: No rhonchi no rales . Abdomen: soft . Skin: no rash seen on limited exam . Musculoskeletal: not rigid . Psychiatric:unable to assess . Neurologic: no seizure no involuntary movements         Lab Data:   Basic Metabolic Panel: Recent Labs  Lab 07/15/18 0501  NA 137  K 4.0  CL 103  CO2 26  GLUCOSE 99  BUN 48*  CREATININE 1.07*  CALCIUM 11.1*    ABG: No results for input(s): PHART, PCO2ART, PO2ART, HCO3, O2SAT in the last 168 hours.  Liver Function Tests: No results for input(s): AST, ALT, ALKPHOS, BILITOT, PROT, ALBUMIN in the last 168 hours. No results for input(s): LIPASE, AMYLASE in the last 168 hours. No results for input(s): AMMONIA in the last 168 hours.  CBC: Recent Labs  Lab 07/15/18 0501  WBC 5.8  HGB 10.3*  HCT 33.1*  MCV 88.3  PLT 255    Cardiac Enzymes: No results for input(s): CKTOTAL, CKMB, CKMBINDEX, TROPONINI in the last 168 hours.  BNP (last 3 results) No results for input(s): BNP in the last 8760 hours.  ProBNP (last 3 results) No results for input(s): PROBNP in the last 8760 hours.  Radiological Exams: No results  found.  Assessment/Plan Principal Problem:   Acute on chronic respiratory failure with hypoxia (HCC) Active Problems:   Severe sepsis with septic shock (HCC)   CKD (chronic kidney disease), stage III (HCC)   Dementia, in, senility, without behavioral disturbance   Aspiration pneumonia of both lower lobes due to gastric secretions (HCC)   1. Acute on chronic respiratory failure with hypoxia we will be at baseline on T collar continue pulmonary toilet secretion management. 2. Severe sepsis resolved 3. Chronic kidney disease monitor labs 4. Dementia unchanged 5. Pneumonia due to aspiration at baseline resolved   I have personally seen and evaluated the patient, evaluated laboratory and imaging results, formulated the assessment and plan and placed orders. The Patient requires high complexity decision making for assessment and support.  Case was discussed on Rounds with the Respiratory Therapy Staff  Yevonne Pax, MD [facility] Pulmonary Critical Care Medicine Sleep Medicine

## 2018-07-19 DIAGNOSIS — F0390 Unspecified dementia without behavioral disturbance: Secondary | ICD-10-CM | POA: Diagnosis not present

## 2018-07-19 DIAGNOSIS — N183 Chronic kidney disease, stage 3 (moderate): Secondary | ICD-10-CM | POA: Diagnosis not present

## 2018-07-19 DIAGNOSIS — J9621 Acute and chronic respiratory failure with hypoxia: Secondary | ICD-10-CM | POA: Diagnosis not present

## 2018-07-19 DIAGNOSIS — J690 Pneumonitis due to inhalation of food and vomit: Secondary | ICD-10-CM | POA: Diagnosis not present

## 2018-07-19 LAB — BASIC METABOLIC PANEL
ANION GAP: 10 (ref 5–15)
BUN: 74 mg/dL — ABNORMAL HIGH (ref 8–23)
CALCIUM: 10.8 mg/dL — AB (ref 8.9–10.3)
CO2: 24 mmol/L (ref 22–32)
Chloride: 100 mmol/L (ref 98–111)
Creatinine, Ser: 1.83 mg/dL — ABNORMAL HIGH (ref 0.44–1.00)
GFR calc non Af Amer: 24 mL/min — ABNORMAL LOW (ref >60)
GFR, EST AFRICAN AMERICAN: 28 mL/min — AB (ref >60)
Glucose, Bld: 87 mg/dL (ref 70–99)
POTASSIUM: 4.1 mmol/L (ref 3.5–5.1)
SODIUM: 134 mmol/L — AB (ref 135–145)

## 2018-07-19 LAB — URINALYSIS, ROUTINE W REFLEX MICROSCOPIC
Bilirubin Urine: NEGATIVE
GLUCOSE, UA: NEGATIVE mg/dL
HGB URINE DIPSTICK: NEGATIVE
KETONES UR: NEGATIVE mg/dL
Nitrite: POSITIVE — AB
PH: 5 (ref 5.0–8.0)
PROTEIN: NEGATIVE mg/dL
Specific Gravity, Urine: 1.013 (ref 1.005–1.030)

## 2018-07-19 LAB — CBC
HEMATOCRIT: 29.9 % — AB (ref 36.0–46.0)
HEMOGLOBIN: 9.3 g/dL — AB (ref 12.0–15.0)
MCH: 27.2 pg (ref 26.0–34.0)
MCHC: 31.1 g/dL (ref 30.0–36.0)
MCV: 87.4 fL (ref 78.0–100.0)
PLATELETS: 253 10*3/uL (ref 150–400)
RBC: 3.42 MIL/uL — ABNORMAL LOW (ref 3.87–5.11)
RDW: 18.5 % — AB (ref 11.5–15.5)
WBC: 6.8 10*3/uL (ref 4.0–10.5)

## 2018-07-19 NOTE — Progress Notes (Signed)
Pulmonary Critical Care Medicine [facility] [place]   PULMONARY CRITICAL CARE SERVICE  PROGRESS NOTE  Date of Service: 07/19/2018  MARQUESHA BEOUGHER  [id number]  DOB: [birth year]   DOA: 06/19/2018  Referring Physician: Carron Curie, MD  HPI: Erica Johnson is a 84 y.o. female seen for follow up of Acute on Chronic Respiratory Failure.  Comfortable without distress at this time patient is on T collar  Medications: Reviewed on Rounds  Physical Exam:  Vitals: Temperature 98.1 pulse 99 respiratory rate 24 blood pressure 101/49 saturations 98%  Ventilator Settings off the ventilator on T collar FiO2 28%  . General: Comfortable at this time . Eyes: Grossly normal lids, irises & conjunctiva . ENT: grossly tongue is normal . Neck: no obvious mass . Cardiovascular: S1 S2 normal no gallop . Respiratory: No rhonchi or rales are noted . Abdomen: soft . Skin: no rash seen on limited exam . Musculoskeletal: not rigid . Psychiatric:unable to assess . Neurologic: no seizure no involuntary movements         Lab Data:   Basic Metabolic Panel: Recent Labs  Lab 07/15/18 0501 07/19/18 0650  NA 137 134*  K 4.0 4.1  CL 103 100  CO2 26 24  GLUCOSE 99 87  BUN 48* 74*  CREATININE 1.07* 1.83*  CALCIUM 11.1* 10.8*    ABG: No results for input(s): PHART, PCO2ART, PO2ART, HCO3, O2SAT in the last 168 hours.  Liver Function Tests: No results for input(s): AST, ALT, ALKPHOS, BILITOT, PROT, ALBUMIN in the last 168 hours. No results for input(s): LIPASE, AMYLASE in the last 168 hours. No results for input(s): AMMONIA in the last 168 hours.  CBC: Recent Labs  Lab 07/15/18 0501 07/19/18 0650  WBC 5.8 6.8  HGB 10.3* 9.3*  HCT 33.1* 29.9*  MCV 88.3 87.4  PLT 255 253    Cardiac Enzymes: No results for input(s): CKTOTAL, CKMB, CKMBINDEX, TROPONINI in the last 168 hours.  BNP (last 3 results) No results for input(s): BNP in the last 8760 hours.  ProBNP (last 3  results) No results for input(s): PROBNP in the last 8760 hours.  Radiological Exams: No results found.  Assessment/Plan Principal Problem:   Acute on chronic respiratory failure with hypoxia (HCC) Active Problems:   Severe sepsis with septic shock (HCC)   CKD (chronic kidney disease), stage III (HCC)   Dementia, in, senility, without behavioral disturbance   Aspiration pneumonia of both lower lobes due to gastric secretions (HCC)   1. Acute on chronic respiratory failure with hypoxia we will continue with weaning on the T collar patient's at baseline continue aggressive pulmonary toilet supportive care 2. Dementia unchanged 3. Aspiration pneumoniaTreated at baseline we will continue with supportive care 4. Severe sepsis resolved 5. Chronic kidney disease monitor labs   I have personally seen and evaluated the patient, evaluated laboratory and imaging results, formulated the assessment and plan and placed orders. The Patient requires high complexity decision making for assessment and support.  Case was discussed on Rounds with the Respiratory Therapy Staff  Yevonne Pax, MD [facility] Pulmonary Critical Care Medicine Sleep Medicine

## 2018-07-20 DIAGNOSIS — F0390 Unspecified dementia without behavioral disturbance: Secondary | ICD-10-CM | POA: Diagnosis not present

## 2018-07-20 DIAGNOSIS — J9621 Acute and chronic respiratory failure with hypoxia: Secondary | ICD-10-CM | POA: Diagnosis not present

## 2018-07-20 DIAGNOSIS — J690 Pneumonitis due to inhalation of food and vomit: Secondary | ICD-10-CM | POA: Diagnosis not present

## 2018-07-20 DIAGNOSIS — N183 Chronic kidney disease, stage 3 (moderate): Secondary | ICD-10-CM | POA: Diagnosis not present

## 2018-07-20 NOTE — Progress Notes (Signed)
Pulmonary Critical Care Medicine [facility] [place]   PULMONARY CRITICAL CARE SERVICE  PROGRESS NOTE  Date of Service: 07/20/2018  ESI ROSENBAUM  WGN:[id number]  DOB: 01/09/1930   DOA: 06/19/2018  Referring Physician: Carron Curie, MD  HPI: Erica Johnson is a 84 y.o. female seen for follow up of Acute on Chronic Respiratory Failure.  Patient is comfortable no distress remains on T collar awaiting discharge.  Medications: Reviewed on Rounds  Physical Exam:  Vitals: Temperature 98.1 pulse 65 respiratory rate 20 blood pressure 140/61 saturations 98%  Ventilator Settings currently on T collar FiO2 28%  . General: Comfortable at this time . Eyes: Grossly normal lids, irises & conjunctiva . ENT: grossly tongue is normal . Neck: no obvious mass . Cardiovascular: S1 S2 normal no gallop . Respiratory: No rhonchi or rales . Abdomen: soft . Skin: no rash seen on limited exam . Musculoskeletal: not rigid . Psychiatric:unable to assess . Neurologic: no seizure no involuntary movements         Lab Data:   Basic Metabolic Panel: Recent Labs  Lab 07/15/18 0501 07/19/18 0650  NA 137 134*  K 4.0 4.1  CL 103 100  CO2 26 24  GLUCOSE 99 87  BUN 48* 74*  CREATININE 1.07* 1.83*  CALCIUM 11.1* 10.8*    ABG: No results for input(s): PHART, PCO2ART, PO2ART, HCO3, O2SAT in the last 168 hours.  Liver Function Tests: No results for input(s): AST, ALT, ALKPHOS, BILITOT, PROT, ALBUMIN in the last 168 hours. No results for input(s): LIPASE, AMYLASE in the last 168 hours. No results for input(s): AMMONIA in the last 168 hours.  CBC: Recent Labs  Lab 07/15/18 0501 07/19/18 0650  WBC 5.8 6.8  HGB 10.3* 9.3*  HCT 33.1* 29.9*  MCV 88.3 87.4  PLT 255 253    Cardiac Enzymes: No results for input(s): CKTOTAL, CKMB, CKMBINDEX, TROPONINI in the last 168 hours.  BNP (last 3 results) No results for input(s): BNP in the last 8760 hours.  ProBNP (last 3 results) No  results for input(s): PROBNP in the last 8760 hours.  Radiological Exams: No results found.  Assessment/Plan Principal Problem:   Acute on chronic respiratory failure with hypoxia (HCC) Active Problems:   Severe sepsis with septic shock (HCC)   CKD (chronic kidney disease), stage III (HCC)   Dementia, in, senility, without behavioral disturbance   Aspiration pneumonia of both lower lobes due to gastric secretions (HCC)   1. Acute on chronic respiratory failure with hypoxia continue with T collar patient's at baseline continue aggressive pulmonary toilet supportive care. 2. Severe sepsis with shock hemodynamically stable continue present therapy. 3. Chronic kidney disease stage III at baseline follow labs 4. Dementia unchanged 5. Aspiration pneumonia resolved   I have personally seen and evaluated the patient, evaluated laboratory and imaging results, formulated the assessment and plan and placed orders. The Patient requires high complexity decision making for assessment and support.  Case was discussed on Rounds with the Respiratory Therapy Staff  Yevonne Pax, MD [facility] Pulmonary Critical Care Medicine Sleep Medicine

## 2018-07-21 LAB — BASIC METABOLIC PANEL
Anion gap: 6 (ref 5–15)
BUN: 49 mg/dL — AB (ref 8–23)
CALCIUM: 11 mg/dL — AB (ref 8.9–10.3)
CO2: 27 mmol/L (ref 22–32)
CREATININE: 1.1 mg/dL — AB (ref 0.44–1.00)
Chloride: 104 mmol/L (ref 98–111)
GFR, EST AFRICAN AMERICAN: 51 mL/min — AB (ref >60)
GFR, EST NON AFRICAN AMERICAN: 44 mL/min — AB (ref >60)
Glucose, Bld: 106 mg/dL — ABNORMAL HIGH (ref 70–99)
Potassium: 3.8 mmol/L (ref 3.5–5.1)
SODIUM: 137 mmol/L (ref 135–145)

## 2018-07-21 LAB — URINE CULTURE

## 2019-06-11 DEATH — deceased

## 2019-11-10 IMAGING — DX DG ABD PORTABLE 1V
1 series · 1 of 1 positions shown · non-contrast
Comparison: None.

CLINICAL DATA: Evaluate PEG tube placement.

EXAM:
PORTABLE ABDOMEN - 1 VIEW

[abdomen kub]
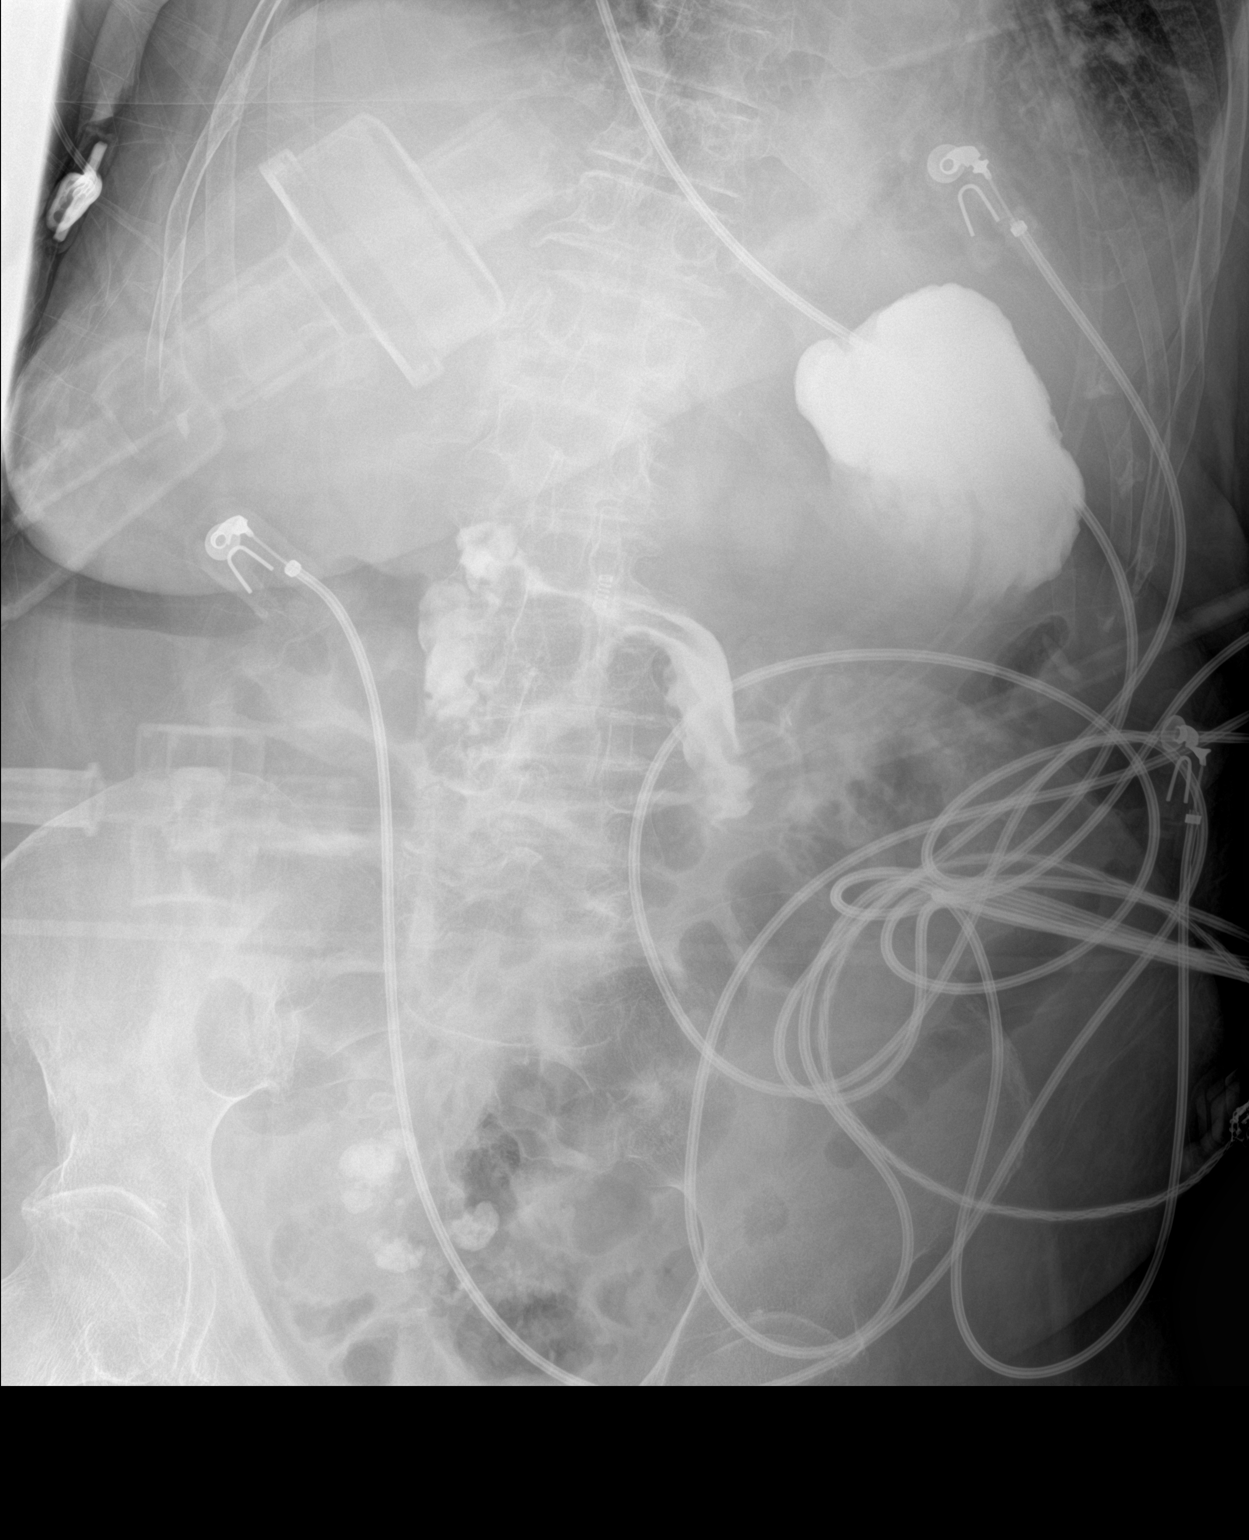

[1 of 1 positions shown; findings below may reference images not displayed]

FINDINGS: Contrast has been injected through the PEG tube and fills the fundus
of the stomach. There is a small amount of contrast in the distal
stomach and probably the proximal duodenum. No evidence of leak.
Left basilar opacity and probable small effusion.
IMPRESSION: 1. The PEG tube appears to terminate in the stomach, confirmed with
contrast injection. No leak.

## 2019-11-10 IMAGING — DX DG CHEST 1V PORT
1 series · 1 of 1 positions shown · non-contrast
Comparison: None.

CLINICAL DATA: New admission.  Respiratory failure.

EXAM:
PORTABLE CHEST 1 VIEW

[chest ap]
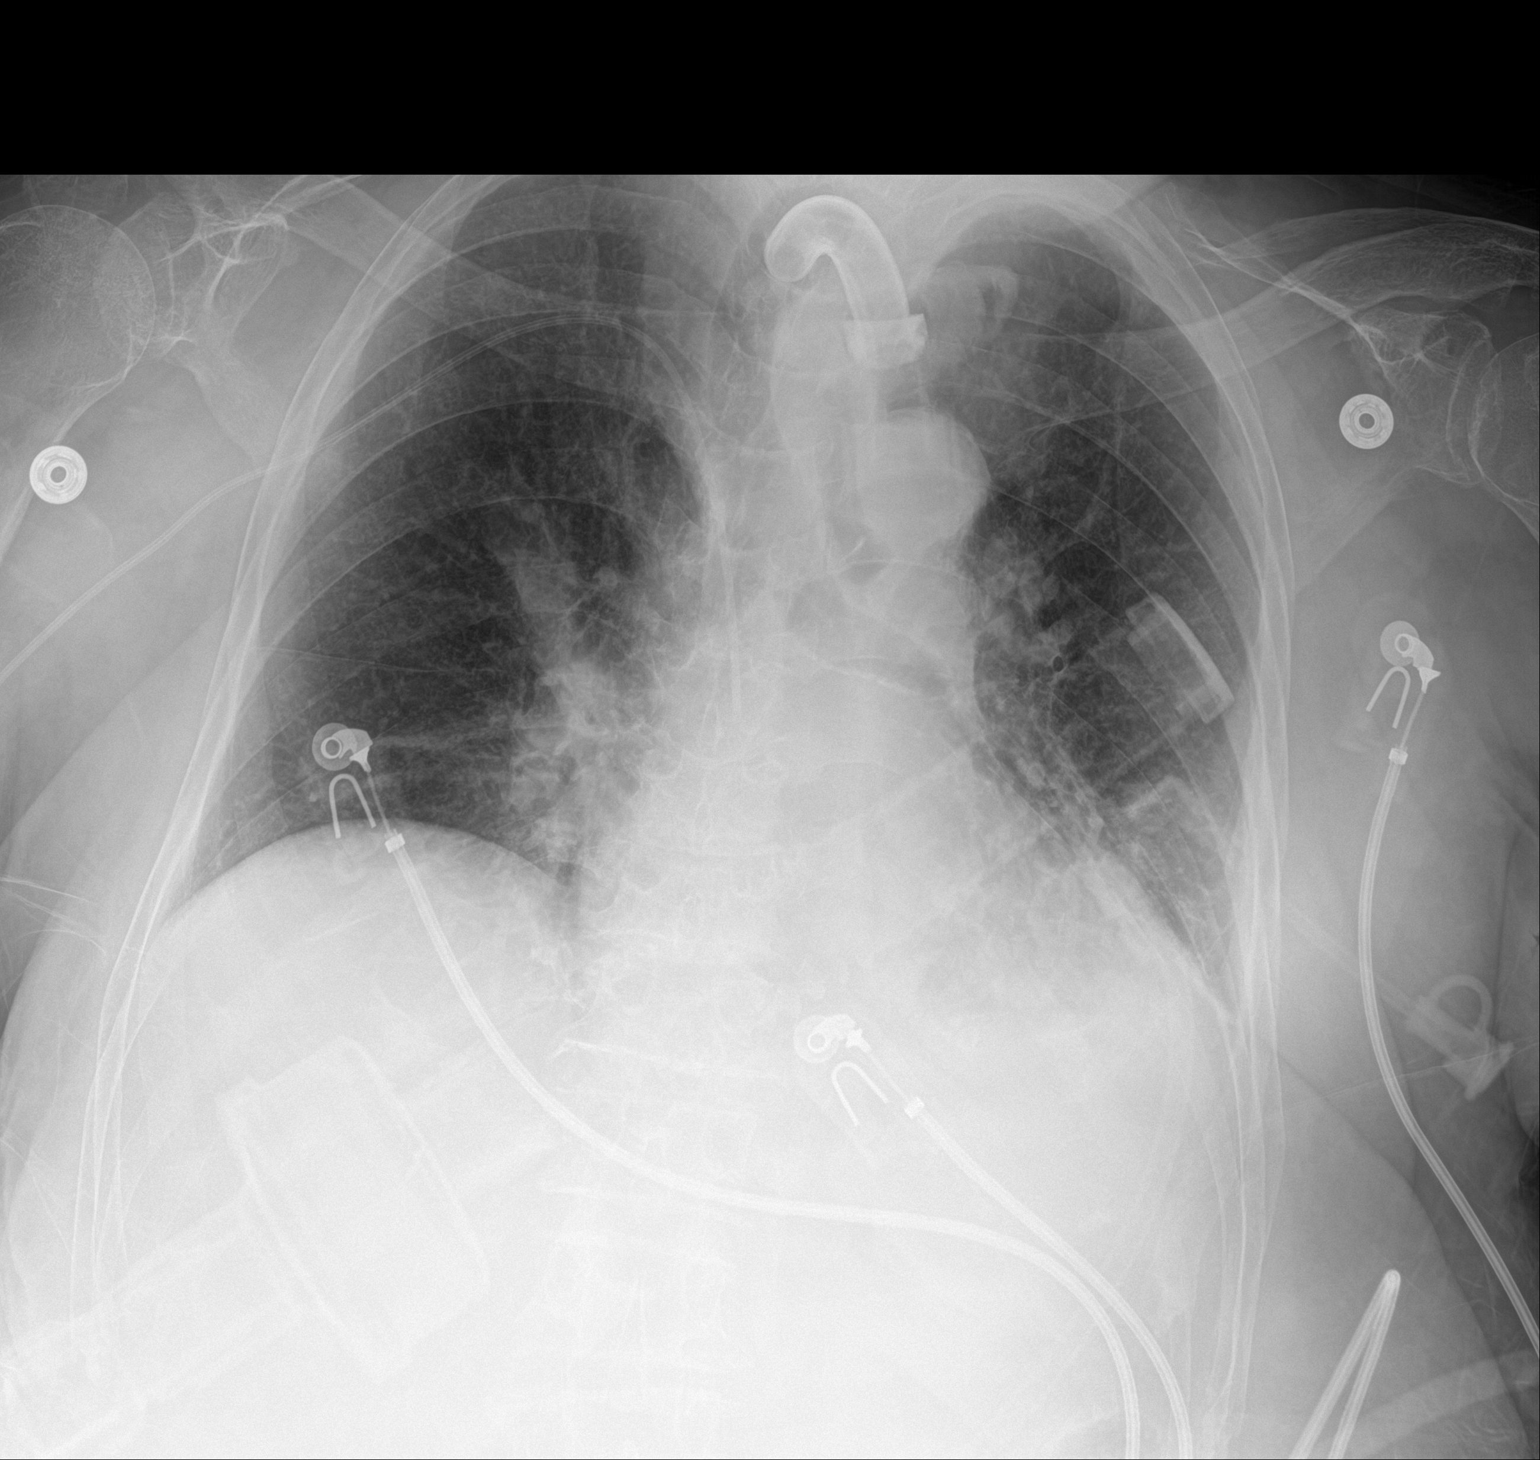

[1 of 1 positions shown; findings below may reference images not displayed]

FINDINGS: No pneumothorax. The right PICC line terminates in the central SVC.
Tracheostomy tube is noted. A tubular device over the left lower
chest is probably on the patient. Probable mild cardiomegaly. The
hila and mediastinum are unremarkable. No pulmonary nodules or
masses. Mild opacity in the left base.
IMPRESSION: Mild opacity in left base. Support apparatus as above. No other
acute abnormalities.

## 2019-11-15 IMAGING — DX DG CHEST 1V PORT
1 series · 1 of 1 positions shown · non-contrast
Comparison: 06/19/2018.

CLINICAL DATA: Chronic ventilator dependent respiratory failure.
Hypoxia. Follow-up LEFT basilar atelectasis and/or pneumonia.

EXAM:
PORTABLE CHEST 1 VIEW

[chest ap]
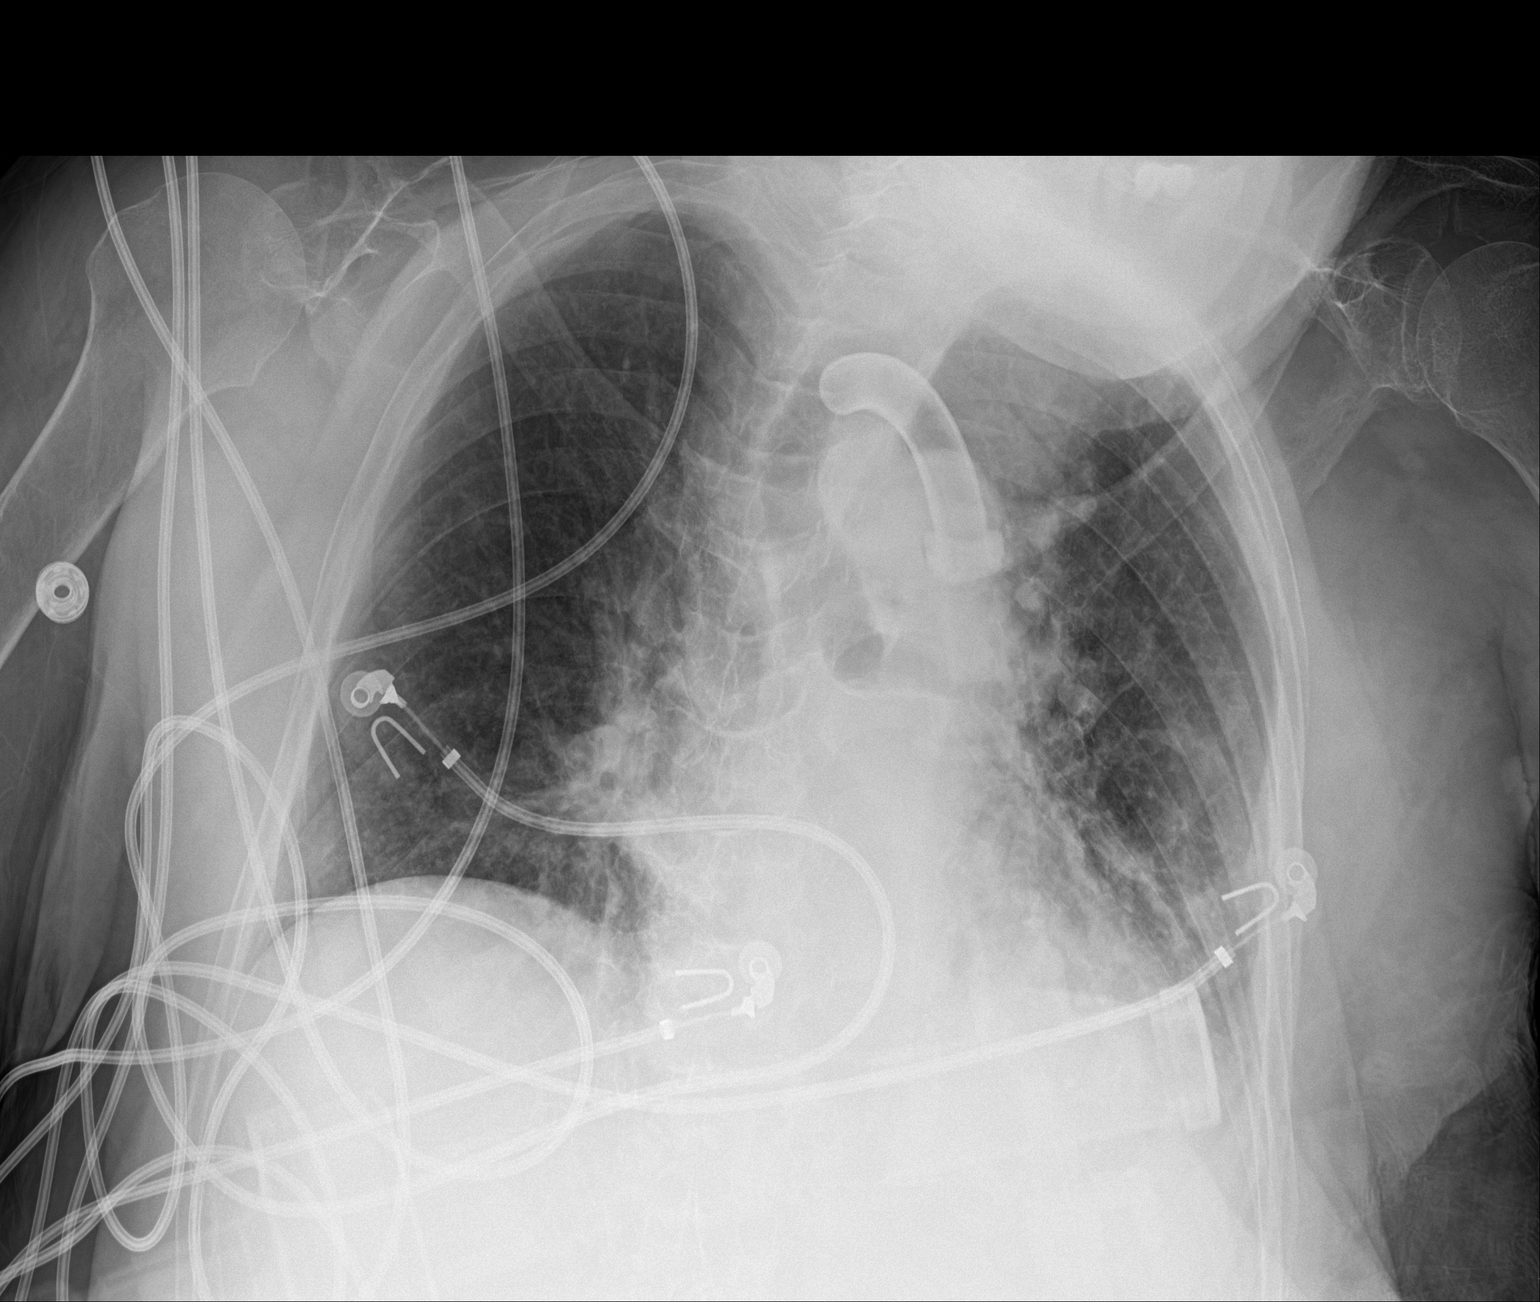

[1 of 1 positions shown; findings below may reference images not displayed]

FINDINGS: Tracheostomy to tip below the thoracic inlet, unchanged. Cardiac
silhouette upper normal in size to slightly enlarged for AP portable
technique, unchanged. Patchy airspace opacities at the LEFT lung
base are unchanged. No new pulmonary parenchymal abnormalities.
Normal pulmonary vascularity. No visible pleural effusions.
IMPRESSION: 1. Stable pneumonia involving the LEFT lung base since the
examination 5 days ago.
2. No new abnormalities.
